# Patient Record
Sex: Male | Born: 1940
Health system: Southern US, Community
[De-identification: ages and names within clinical notes are randomized; demographics above are authoritative.]

## PROBLEM LIST (undated history)

## (undated) DIAGNOSIS — T7840XA Allergy, unspecified, initial encounter: Secondary | ICD-10-CM

## (undated) DIAGNOSIS — Z860100 Personal history of colon polyps, unspecified: Secondary | ICD-10-CM

## (undated) DIAGNOSIS — I10 Essential (primary) hypertension: Secondary | ICD-10-CM

## (undated) DIAGNOSIS — E785 Hyperlipidemia, unspecified: Secondary | ICD-10-CM

## (undated) DIAGNOSIS — M1990 Unspecified osteoarthritis, unspecified site: Secondary | ICD-10-CM

## (undated) DIAGNOSIS — H269 Unspecified cataract: Secondary | ICD-10-CM

## (undated) DIAGNOSIS — R011 Cardiac murmur, unspecified: Secondary | ICD-10-CM

## (undated) DIAGNOSIS — I219 Acute myocardial infarction, unspecified: Secondary | ICD-10-CM

## (undated) DIAGNOSIS — A419 Sepsis, unspecified organism: Secondary | ICD-10-CM

## (undated) DIAGNOSIS — J189 Pneumonia, unspecified organism: Secondary | ICD-10-CM

## (undated) DIAGNOSIS — J45909 Unspecified asthma, uncomplicated: Secondary | ICD-10-CM

## (undated) HISTORY — DX: Personal history of colon polyps, unspecified: Z86.0100

## (undated) HISTORY — DX: Hyperlipidemia, unspecified: E78.5

## (undated) HISTORY — DX: Personal history of colonic polyps: Z86.010

## (undated) HISTORY — DX: Unspecified asthma, uncomplicated: J45.909

## (undated) HISTORY — DX: Unspecified osteoarthritis, unspecified site: M19.90

## (undated) HISTORY — DX: Allergy, unspecified, initial encounter: T78.40XA

## (undated) HISTORY — PX: POLYPECTOMY: SHX149

## (undated) HISTORY — DX: Cardiac murmur, unspecified: R01.1

## (undated) HISTORY — PX: CARDIAC CATHETERIZATION: SHX172

## (undated) HISTORY — DX: Essential (primary) hypertension: I10

## (undated) HISTORY — DX: Acute myocardial infarction, unspecified: I21.9

## (undated) HISTORY — PX: CORONARY STENT PLACEMENT: SHX1402

## (undated) HISTORY — PX: COLONOSCOPY: SHX174

## (undated) HISTORY — PX: CATARACT EXTRACTION: SUR2

## (undated) HISTORY — DX: Unspecified cataract: H26.9

## (undated) HISTORY — PX: INGUINAL HERNIA REPAIR: SUR1180

---

## 2002-06-05 ENCOUNTER — Encounter (INDEPENDENT_AMBULATORY_CARE_PROVIDER_SITE_OTHER): Payer: Self-pay | Admitting: *Deleted

## 2002-06-05 ENCOUNTER — Ambulatory Visit (HOSPITAL_BASED_OUTPATIENT_CLINIC_OR_DEPARTMENT_OTHER): Admission: RE | Admit: 2002-06-05 | Discharge: 2002-06-05 | Payer: Self-pay | Admitting: General Surgery

## 2005-05-03 ENCOUNTER — Encounter: Payer: Self-pay | Admitting: Gastroenterology

## 2010-04-27 ENCOUNTER — Ambulatory Visit: Payer: Self-pay | Admitting: Gastroenterology

## 2010-04-27 DIAGNOSIS — Z860100 Personal history of colon polyps, unspecified: Secondary | ICD-10-CM | POA: Insufficient documentation

## 2010-04-27 DIAGNOSIS — I2510 Atherosclerotic heart disease of native coronary artery without angina pectoris: Secondary | ICD-10-CM | POA: Insufficient documentation

## 2010-04-27 DIAGNOSIS — R195 Other fecal abnormalities: Secondary | ICD-10-CM

## 2010-06-22 ENCOUNTER — Ambulatory Visit: Payer: Self-pay | Admitting: Gastroenterology

## 2010-06-24 ENCOUNTER — Encounter: Payer: Self-pay | Admitting: Gastroenterology

## 2010-07-16 ENCOUNTER — Telehealth: Payer: Self-pay | Admitting: Gastroenterology

## 2010-08-02 ENCOUNTER — Ambulatory Visit: Payer: Self-pay | Admitting: Gastroenterology

## 2010-08-02 LAB — CONVERTED CEMR LAB
Fecal Occult Blood: NEGATIVE
OCCULT 1: NEGATIVE
OCCULT 2: NEGATIVE
OCCULT 3: NEGATIVE
OCCULT 4: NEGATIVE
OCCULT 5: NEGATIVE

## 2010-08-08 ENCOUNTER — Encounter: Payer: Self-pay | Admitting: Gastroenterology

## 2010-10-19 NOTE — Letter (Signed)
Summary: Results Letter  [facility]  [address] [place], [place] [address]   Phone: [phone]  Fax: [phone]        August 08, 2010 MRN: [id number]    [facility] [address] [place], [place]  [address]    Dear Mr. Estill,  Your hemeoccult cards testing for microscopic bleeding were negative.  No further GI workup is required at this time.   Please continue with the recommendations that we previously discussed. Should you have any further questions or concerns, feel free to contact me.    Sincerely,  Barbette Hair. Arlyce Dice, M.D., [facility]         Sincerely,  Louis Meckel MD  This letter has been electronically signed by your physician.  Appended Document: Results Letter letter mailed

## 2010-10-19 NOTE — Letter (Signed)
Summary: [facility] Instructions  [facility]  [address] [place], [place] [address]   Phone: [phone]  Fax: [phone]       Carlos Patton    13-Feb-1941    MRN: [id number]        Procedure Day /Date:TUESDAY 06/22/2010     Arrival Time:8:30AM     Procedure Time:9:30AM     Location of Procedure:                    X   [facility] (4th Floor)                        PREPARATION FOR COLONOSCOPY WITH MOVIPREP   Starting 5 days prior to your procedure 06/18/2010 do not eat nuts, seeds, popcorn, corn, beans, peas,  salads, or any raw vegetables.  Do not take any fiber supplements (e.g. Metamucil, Citrucel, and Benefiber).  THE DAY BEFORE YOUR PROCEDURE         DATE: 06/21/2010 DAY: MONDAY  1.  Drink clear liquids the entire day-NO SOLID FOOD  2.  Do not drink anything colored red or purple.  Avoid juices with pulp.  No orange juice.  3.  Drink at least 64 oz. (8 glasses) of fluid/clear liquids during the day to prevent dehydration and help the prep work efficiently.  CLEAR LIQUIDS INCLUDE: Water Jello Ice Popsicles Tea (sugar ok, no milk/cream) Powdered fruit flavored drinks Coffee (sugar ok, no milk/cream) Gatorade Juice: apple, white grape, white cranberry  Lemonade Clear bullion, consomm, broth Carbonated beverages (any kind) Strained chicken noodle soup Hard Candy                             4.  In the morning, mix first dose of MoviPrep solution:    Empty 1 Pouch A and 1 Pouch B into the disposable container    Add lukewarm drinking water to the top line of the container. Mix to dissolve    Refrigerate (mixed solution should be used within 24 hrs)  5.  Begin drinking the prep at 5:00 p.m. The MoviPrep container is divided by 4 marks.   Every 15 minutes drink the solution down to the next mark (approximately 8 oz) until the full liter is complete.   6.  Follow completed prep with 16 oz of clear liquid of your choice (Nothing  red or purple).  Continue to drink clear liquids until bedtime.  7.  Before going to bed, mix second dose of MoviPrep solution:    Empty 1 Pouch A and 1 Pouch B into the disposable container    Add lukewarm drinking water to the top line of the container. Mix to dissolve    Refrigerate  THE DAY OF YOUR PROCEDURE      DATE: 06/22/2010 DAY: TUESDAY Beginning at 9/30/2011a.m. (5 hours before procedure):         1. Every 15 minutes, drink the solution down to the next mark (approx 8 oz) until the full liter is complete.  2. Follow completed prep with 16 oz. of clear liquid of your choice.    3. You may drink clear liquids until 7:30AM (2 HOURS BEFORE PROCEDURE).   MEDICATION INSTRUCTIONS  Unless otherwise instructed, you should take regular prescription medications with a small sip of water   as early as possible the morning of your procedure.  OTHER INSTRUCTIONS  You will need a responsible adult at least 71 years of age to accompany you and drive you home.   This person must remain in the waiting room during your procedure.  Wear loose fitting clothing that is easily removed.  Leave jewelry and other valuables at home.  However, you may wish to bring a book to read or  an iPod/MP3 player to listen to music as you wait for your procedure to start.  Remove all body piercing jewelry and leave at home.  Total time from sign-in until discharge is approximately 2-3 hours.  You should go home directly after your procedure and rest.  You can resume normal activities the  day after your procedure.  The day of your procedure you should not:   Drive   Make legal decisions   Operate machinery   Drink alcohol   Return to work  You will receive specific instructions about eating, activities and medications before you leave.    The above instructions have been reviewed and explained to me by   _______________________    I fully understand and can verbalize  these instructions _____________________________ Date _________

## 2010-10-19 NOTE — Assessment & Plan Note (Signed)
Summary: blood in stool/? time for colon/lk   History of Present Illness Visit Type: Initial Visit Primary GI MD: Melvia Heaps MD [facility] Primary Provider: Elenor Quinones, MD Chief Complaint: blood in stool needs colonoscopy History of Present Illness:   Carlos Patton is a pleasant 70 year old white male referred at the request of Dr. Kathaleen Bury for heme-positive stools.  This was noted on routine testing.  At times he has had minimal blood with a bowel movement that he attriburtes to straining and to a hemorrhoid.  The patient denies change in bowel habits, abdominal or rectal pain.  His history of adenomatous polyps that were removed in 2006.  Family history is pertinent for a sister who developed colon cancer in her 42s.   GI Review of Systems      Denies abdominal pain, acid reflux, belching, bloating, chest pain, dysphagia with liquids, dysphagia with solids, heartburn, loss of appetite, nausea, vomiting, vomiting blood, weight loss, and  weight gain.      Reports hemorrhoids and  rectal bleeding.     Denies anal fissure, black tarry stools, change in bowel habit, constipation, diarrhea, diverticulosis, fecal incontinence, heme positive stool, irritable bowel syndrome, jaundice, light color stool, liver problems, and  rectal pain.    Current Medications (verified): 1)  Trexall 5 Mg Tabs (Methotrexate Sodium) .... 5 Per Week 2)  Atenolol 25 Mg Tabs (Atenolol) .Marland Kitchen.. 1 By Mouth Once Daily 3)  Aspirin 325 Mg Tabs (Aspirin) .Marland Kitchen.. 1 By Mouth Once Daily  Allergies (verified): No Known Drug Allergies  Past History:  Past Medical History: Arthritis Adenomatous Colon Polyps Coronary Artery Disease  Past Surgical History: Heart Stent Placement Inguinal Hernia repair x 2  Family History: Reviewed history and no changes required. Family History of Colon Cancer:sister diabetes 2 sisters Heart Disease father,p -grandfather, p-grandmother, sister  Social History: Reviewed history and no  changes required. Married  2 boys Retired Quit 40 years ago Alcohol Use - no Daily Caffeine Use Illicit Drug Use - no Drug Use:  no  Review of Systems  The patient denies allergy/sinus, anemia, anxiety-new, arthritis/joint pain, back pain, blood in urine, breast changes/lumps, change in vision, confusion, cough, coughing up blood, depression-new, fainting, fatigue, fever, headaches-new, hearing problems, heart murmur, heart rhythm changes, itching, muscle pains/cramps, night sweats, nosebleeds, shortness of breath, skin rash, sleeping problems, sore throat, swelling of feet/legs, swollen lymph glands, thirst - excessive, urination - excessive, urination changes/pain, urine leakage, vision changes, and voice change.    Vital Signs:  Patient profile:   70 year old male Height:      75 inches Weight:      214 pounds BMI:     26.84 Pulse rate:   68 / minute Pulse rhythm:   regular BP sitting:   126 / 72  (left arm)  Vitals Entered By: Milford Cage NCMA (April 27, 2010 1:49 PM)  Physical Exam  Additional Exam:  On physical exam he is a well-developed well-nourished male  Physical Exam: General:   WDWN HEENT:   anicteric.  No pharyngeal abnormalities Neck:   No masses, thyroidmegaly Nodes:   No cervical, axillary, inguinal adenopathy Chest:    Clear to auscultation Cardiac:   No murmurs, gallops, rubs Abdomen:   BS active.  No abd masses, tenderness, organomegaly Rectal:   Deferred Extremities:   No cyanosis, clubbing, edema Skeletal:   No deformities Neuro:   Alert, oriented x3.  No focal abnormalities       Impression & Recommendations:  Problem # 1:  NONSPECIFIC ABNORMAL FINDING IN STOOL CONTENTS (ICD-792.1)  Plan colonoscopy to rule out recurrent polyps and other GI bleeding sources including hemorrhoids AVMs and neoplasm  Risks, alternatives, and complications of the procedure, including bleeding, perforation, and possible need for surgery, were  explained to the patient.  Patient's questions were answered.  Orders: Colonoscopy (Colon)  Problem # 2:  PERSONAL HISTORY OF COLONIC POLYPS (ICD-V12.72) Assessment: Comment Only  Orders: Colonoscopy (Colon)  Problem # 3:  FM HX MALIGNANT NEOPLASM GASTROINTESTINAL TRACT (ICD-V16.0)  Plan colonoscopy if he 5 years for the next 10 years  Orders: Colonoscopy (Colon)  Problem # 4:  CORONARY ARTERY DISEASE (ICD-414.00) Assessment: Comment Only  Patient Instructions: 1)  Copy sent to : Elenor Quinones, MD 2)  Colonoscopy and Flexible Sigmoidoscopy brochure given.  3)  Conscious Sedation brochure given.  4)  Your colonoscopy is scheduled for 06/22/2010 at 9:30am 5)  You can pick up your MoviPrep from your pharmacy today 6)  The medication list was reviewed and reconciled.  All changed / newly prescribed medications were explained.  A complete medication list was provided to the patient / caregiver. Prescriptions: MOVIPREP 100 GM  SOLR (PEG-KCL-NACL-NASULF-NA ASC-C) As per prep instructions.  #1 x 0   Entered by:   Merri Ray CMA (AAMA)   Authorized by:   Louis Meckel MD   Signed by:   Merri Ray CMA (AAMA) on 04/27/2010   Method used:   Electronically to        [facility] (retail)       [address]       [place], [place]  [address]       Ph: [phone]       Fax: [phone]   RxID:   [id number]

## 2010-10-19 NOTE — Progress Notes (Signed)
Summary: Hemoccult reminder  Phone Note Outgoing Call Call back at [facility] Phone [phone]   Call placed by: Merri Ray CMA Duncan Dull),  July 16, 2010 2:50 PM Summary of Call: Called pt to remind him to return his hemoccult cards. Spoke with wife and she stated she had tried and tried to get him to do them and go ahead and turn them in but he has not done them yet. She will say something else to him today. She did state that pt was doing very well. Tols pt to call back as needed  Initial call taken by: Merri Ray CMA Duncan Dull),  July 16, 2010 2:51 PM

## 2010-10-19 NOTE — Procedures (Signed)
Summary: Colonoscopy/[facility]  Colonoscopy/[facility]   Imported By: Sherian Rein 04/30/2010 10:18:05  _____________________________________________________________________  External Attachment:    Type:   Image     Comment:   External Document

## 2010-10-19 NOTE — Letter (Signed)
Summary: Results Letter  [facility]  [address] [place], [place] [address]   Phone: [phone]  Fax: [phone]        April 27, 2010 MRN: [id number]    [facility] [address] [place], [place]  [address]    Dear Mr. Wadsworth,  It is my pleasure to have treated you recently as a new patient in my office. I appreciate your confidence and the opportunity to participate in your care.  Since I do have a busy inpatient endoscopy schedule and office schedule, my office hours vary weekly. I am, however, available for emergency calls everyday through my office. If I am not available for an urgent office appointment, another one of our gastroenterologist will be able to assist you.  My well-trained staff are prepared to help you at all times. For emergencies after office hours, a physician from our Gastroenterology section is always available through my 24 hour answering service  Once again I welcome you as a new patient and I look forward to a happy and healthy relationship             Sincerely,  Louis Meckel MD  This letter has been electronically signed by your physician.  Appended Document: Results Letter LETTER MAILED

## 2010-10-19 NOTE — Procedures (Signed)
Summary: Colonoscopy  Patient: Carlos Patton Note: All result statuses are Final unless otherwise noted.  Tests: (1) Colonoscopy (COL)   COL Colonoscopy           DONE     [facility]     [address]     [place], [place]  [address]           COLONOSCOPY PROCEDURE REPORT           PATIENT:  Carlos Patton, Carlos Patton  MR#:  [id number]     BIRTHDATE:  Aug 07, 1941, 68 yrs. old  GENDER:  male           ENDOSCOPIST:  Barbette Hair. Arlyce Dice, MD     Referred by:           PROCEDURE DATE:  06/22/2010     PROCEDURE:  Colon with cold biopsy polypectomy     ASA CLASS:  Class II     INDICATIONS:  1) heme positive stool  2) history of pre-cancerous     (adenomatous) colon polyps  3) family history of colon cancer     Sister           MEDICATIONS:   Fentanyl 50 mcg IV, Versed 5 mg IV           DESCRIPTION OF PROCEDURE:   After the risks benefits and     alternatives of the procedure were thoroughly explained, informed     consent was obtained.  Digital rectal exam was performed and     revealed no abnormalities.   The [id number] endoscope was     introduced through the anus and advanced to the cecum, which was     identified by the ileocecal valve, without limitations.  The     quality of the prep was excellent, using MiraLax.  The instrument     was then slowly withdrawn as the colon was fully examined.     <<PROCEDUREIMAGES>>           FINDINGS:  Two polyps were found (see image7 and image8). 2 1-23mm     polyps midtransverse colon - removed with cold bx forceps,     submitted to pathology  Mild diverticulosis was found in the     sigmoid colon (see image17 and image16).  This was otherwise a     normal examination of the colon (see image1, image3, image4,     image6, image10, image13, image14, image18, and image19).     Retroflexed views in the rectum revealed no abnormalities.    The     time to cecum =  4.50  minutes. The scope was then withdrawn (time     =  11.30  min) from the  patient and the procedure completed.           COMPLICATIONS:  None           ENDOSCOPIC IMPRESSION:     1) Two diminutive polyps     2) Mild diverticulosis in the sigmoid colon     3) Otherwise normal examination     RECOMMENDATIONS:     1) Given your significant family history of colon cancer, you     should have a repeat colonoscopy in 5 years     2) followup hemeoccults in 10 days           REPEAT EXAM:  In 5 year(s) for Colonoscopy.           ______________________________  Barbette Hair. Arlyce Dice, MD           CC: Elenor Quinones MD           n.     Rosalie Doctor:   Barbette Hair. Azuree Minish at 06/22/2010 10:27 AM           Page 2 of 3   [place], [place], [id number]  Note: An exclamation mark (!) indicates a result that was not dispersed into the flowsheet. Document Creation Date: 06/22/2010 10:28 AM _______________________________________________________________________  (1) Order result status: Final Collection or observation date-time: 06/22/2010 10:20 Requested date-time:  Receipt date-time:  Reported date-time:  Referring Physician:   Ordering Physician: Melvia Heaps [phone]) Specimen Source:  Source: Launa Grill Order Number: [id number] Lab site:   Appended Document: Colonoscopy     Procedures Next Due Date:    Colonoscopy: 06/2015  Appended Document: Orders Update    Clinical Lists Changes  Orders: Added new Test order of [facility] Hemoccult Cards #3 (take home) (Hem cards #3) - Signed

## 2010-10-19 NOTE — Letter (Signed)
Summary: Patient Notice- Polyp Results  [facility]  [address] [place], [place] [address]   Phone: [phone]  Fax: [phone]        June 24, 2010 MRN: [id number]    [facility] [address] [place], [place]  [address]    Dear Carlos Patton,  I am pleased to inform you that the colon polyp(s) removed during your recent colonoscopy was (were) found to be benign (no cancer detected) upon pathologic examination.  I recommend you have a repeat colonoscopy examination in 5_ years to look for recurrent polyps, as having colon polyps increases your risk for having recurrent polyps or even colon cancer in the future.  Should you develop new or worsening symptoms of abdominal pain, bowel habit changes or bleeding from the rectum or bowels, please schedule an evaluation with either your primary care physician or with me.  Additional information/recommendations:  __ No further action with gastroenterology is needed at this time. Please      follow-up with your primary care physician for your other healthcare      needs.  __ Please call [phone] to schedule a return visit to review your      situation.  __ Please keep your follow-up visit as already scheduled.  _x_ Continue treatment plan as outlined the day of your exam.  Please call us if you are having persistent problems or have questions about your condition that have not been fully answered at this time.  Sincerely,  Louis Meckel MD  This letter has been electronically signed by your physician.  Appended Document: Patient Notice- Polyp Results letter mailed

## 2011-02-04 NOTE — Op Note (Signed)
   NAME:  Carlos Patton, Carlos Patton                       ACCOUNT NO.:  [id number]   MEDICAL RECORD NO.:  [id number]                   PATIENT TYPE:  AMB   LOCATION:  DSC                                  FACILITY:  [facility]   PHYSICIAN:  Ollen Gross. Vernell Morgans, M.D.              DATE OF BIRTH:  [birth year]   DATE OF PROCEDURE:  06/05/2002  DATE OF DISCHARGE:                                 OPERATIVE REPORT   PREOPERATIVE DIAGNOSIS:  Cyst on the right neck.   POSTOPERATIVE DIAGNOSIS:  Cyst on the right neck.   PROCEDURE:  Excision of cyst from right neck.   SURGEON:  Ollen Gross. Carolynne Edouard, M.D.   ASSISTANT:  Nurse.   ANESTHESIA:  Local.   DESCRIPTION OF PROCEDURE:  After informed consent was obtained, the patient  was brought to the operating room and placed in a supine position on the  operating table.  The area of the right neck was prepped with Betadine and  draped in the usual sterile manner.  The area in question was then  infiltrated with 1% lidocaine with epinephrine until a good field block was  obtained.  An elliptical incision was then made around the cyst sharply with  a 15 blade knife.  The cyst itself was then removed from the rest of the  subcutaneous tissue with continued sharp dissection with the 15 blade knife.  The remaining tissue appeared healthy.  The incision was then closed with  interrupted 4-0 Monocryl subcuticular stitches.  Benzoin and Steri-Strips  and sterile dressings were applied.  The patient tolerated the procedure  well.  At the end of the case all needle, sponge, and instrument counts were  correct.  The patient was awakened and taken to the recovery room in stable  condition.                                               Ollen Gross. Vernell Morgans, M.D.    PST/MEDQ  D:  06/06/2002  T:  06/07/2002  Job:  [id number]

## 2012-08-09 ENCOUNTER — Telehealth: Payer: Self-pay | Admitting: Internal Medicine

## 2012-08-09 NOTE — Telephone Encounter (Signed)
Pts wife called and said that she was returning a call from nurse about pt est with Dr Fabian Sharp. Pt has MCR and BC[employer]. Pls call.

## 2012-08-10 NOTE — Telephone Encounter (Signed)
Am currently not taking NEW medicare/ medicare  aged patients.  Please advise about how I am getting this request. Dont think i know this family.

## 2012-08-10 NOTE — Telephone Encounter (Signed)
This patient would like to establish with you.  Pt has Medicare and BCBS.  Please advise.

## 2012-08-13 NOTE — Telephone Encounter (Signed)
Called and lft pts spouse vm that Dr Fabian Sharp has declined pts req to est as pcp.

## 2015-06-02 ENCOUNTER — Encounter: Payer: Self-pay | Admitting: Gastroenterology

## 2015-08-17 ENCOUNTER — Encounter: Payer: Self-pay | Admitting: Gastroenterology

## 2015-10-06 ENCOUNTER — Ambulatory Visit (AMBULATORY_SURGERY_CENTER): Payer: Self-pay | Admitting: *Deleted

## 2015-10-06 VITALS — Ht 75.0 in | Wt 217.0 lb

## 2015-10-06 DIAGNOSIS — Z800 Family history of malignant neoplasm of digestive organs: Secondary | ICD-10-CM

## 2015-10-06 DIAGNOSIS — Z86010 Personal history of colonic polyps: Secondary | ICD-10-CM

## 2015-10-06 NOTE — Progress Notes (Signed)
No egg or soy allergy known to patient  No issues with past sedation with any surgeries  or procedures, no intubation problems  No diet pills per patient  No home 02 use per patient  No blood thinners but takes a 325 mg ASA daily

## 2015-10-12 DIAGNOSIS — H353122 Nonexudative age-related macular degeneration, left eye, intermediate dry stage: Secondary | ICD-10-CM | POA: Diagnosis not present

## 2015-10-12 DIAGNOSIS — H353112 Nonexudative age-related macular degeneration, right eye, intermediate dry stage: Secondary | ICD-10-CM | POA: Diagnosis not present

## 2015-10-12 DIAGNOSIS — H43813 Vitreous degeneration, bilateral: Secondary | ICD-10-CM | POA: Diagnosis not present

## 2015-10-12 DIAGNOSIS — D3132 Benign neoplasm of left choroid: Secondary | ICD-10-CM | POA: Diagnosis not present

## 2015-10-20 ENCOUNTER — Ambulatory Visit (AMBULATORY_SURGERY_CENTER): Payer: PPO | Admitting: Gastroenterology

## 2015-10-20 ENCOUNTER — Encounter: Payer: Self-pay | Admitting: Gastroenterology

## 2015-10-20 VITALS — BP 133/85 | HR 49 | Temp 97.0°F | Resp 16 | Ht 75.0 in | Wt 217.0 lb

## 2015-10-20 DIAGNOSIS — D123 Benign neoplasm of transverse colon: Secondary | ICD-10-CM

## 2015-10-20 DIAGNOSIS — J45909 Unspecified asthma, uncomplicated: Secondary | ICD-10-CM | POA: Diagnosis not present

## 2015-10-20 DIAGNOSIS — D122 Benign neoplasm of ascending colon: Secondary | ICD-10-CM | POA: Diagnosis not present

## 2015-10-20 DIAGNOSIS — Z86010 Personal history of colonic polyps: Secondary | ICD-10-CM | POA: Diagnosis not present

## 2015-10-20 DIAGNOSIS — D128 Benign neoplasm of rectum: Secondary | ICD-10-CM

## 2015-10-20 DIAGNOSIS — D124 Benign neoplasm of descending colon: Secondary | ICD-10-CM | POA: Diagnosis not present

## 2015-10-20 DIAGNOSIS — Z800 Family history of malignant neoplasm of digestive organs: Secondary | ICD-10-CM | POA: Diagnosis not present

## 2015-10-20 DIAGNOSIS — I252 Old myocardial infarction: Secondary | ICD-10-CM | POA: Diagnosis not present

## 2015-10-20 DIAGNOSIS — I10 Essential (primary) hypertension: Secondary | ICD-10-CM | POA: Diagnosis not present

## 2015-10-20 MED ORDER — SODIUM CHLORIDE 0.9 % IV SOLN: 500.0000 mL | INTRAVENOUS | Status: DC

## 2015-10-20 NOTE — Patient Instructions (Signed)
  AVOID NSAIDS (MOTRIN, ADVIL, IBUPROFEN, ALEVE, NAPROSYN ETC) FOR TWO WEEKS, FEBRUARY 15,2017.     YOU HAD AN ENDOSCOPIC PROCEDURE TODAY AT [facility]:   Refer to the procedure report that was given to you for any specific questions about what was found during the examination.  If the procedure report does not answer your questions, please call your gastroenterologist to clarify.  If you requested that your care partner not be given the details of your procedure findings, then the procedure report has been included in a sealed envelope for you to review at your convenience later.  YOU SHOULD EXPECT: Some feelings of bloating in the abdomen. Passage of more gas than usual.  Walking can help get rid of the air that was put into your GI tract during the procedure and reduce the bloating. If you had a lower endoscopy (such as a colonoscopy or flexible sigmoidoscopy) you may notice spotting of blood in your stool or on the toilet paper. If you underwent a bowel prep for your procedure, you may not have a normal bowel movement for a few days.  Please Note:  You might notice some irritation and congestion in your nose or some drainage.  This is from the oxygen used during your procedure.  There is no need for concern and it should clear up in a day or so.  SYMPTOMS TO REPORT IMMEDIATELY:   Following lower endoscopy (colonoscopy or flexible sigmoidoscopy):  Excessive amounts of blood in the stool  Significant tenderness or worsening of abdominal pains  Swelling of the abdomen that is new, acute  Fever of 100F or higher  For urgent or emergent issues, a gastroenterologist can be reached at any hour by calling [phone].   DIET: Your first meal following the procedure should be a small meal and then it is ok to progress to your normal diet. Heavy or fried foods are harder to digest and may make you feel nauseous or bloated.  Likewise, meals heavy in dairy and vegetables can  increase bloating.  Drink plenty of fluids but you should avoid alcoholic beverages for 24 hours.  ACTIVITY:  You should plan to take it easy for the rest of today and you should NOT DRIVE or use heavy machinery until tomorrow (because of the sedation medicines used during the test).    FOLLOW UP: Our staff will call the number listed on your records the next business day following your procedure to check on you and address any questions or concerns that you may have regarding the information given to you following your procedure. If we do not reach you, we will leave a message.  However, if you are feeling well and you are not experiencing any problems, there is no need to return our call.  We will assume that you have returned to your regular daily activities without incident.  If any biopsies were taken you will be contacted by phone or by letter within the next 1-3 weeks.  Please call us at [phone] if you have not heard about the biopsies in 3 weeks.    SIGNATURES/CONFIDENTIALITY: You and/or your care partner have signed paperwork which will be entered into your electronic medical record.  These signatures attest to the fact that that the information above on your After Visit Summary has been reviewed and is understood.  Full responsibility of the confidentiality of this discharge information lies with you and/or your care-partner.

## 2015-10-20 NOTE — Progress Notes (Signed)
Called to room to assist during endoscopic procedure.  Patient ID and intended procedure confirmed with present staff. Received instructions for my participation in the procedure from the performing physician.  

## 2015-10-20 NOTE — Op Note (Signed)
[place]  [address] [place], [id number]   COLONOSCOPY PROCEDURE REPORT  PATIENT: Carlos Patton, Carlos Patton  MR#: [id number] BIRTHDATE: Dec 18, 1940 , 74  yrs. old GENDER: male ENDOSCOPIST: Yetta Flock, MD REFERRED BY: PROCEDURE DATE:  10/20/2015 PROCEDURE:   Colonoscopy, surveillance , Colonoscopy with snare polypectomy, and Colonoscopy with biopsy First Screening Colonoscopy - Avg.  risk and is 50 yrs.  old or older - No.  Prior Negative Screening - Now for repeat screening. N/A  History of Adenoma - Now for follow-up colonoscopy & has been > or = to 3 yrs.  Yes hx of adenoma.  Has been 3 or more years since last colonoscopy.  Polyps removed today? Yes ASA CLASS:   Class III INDICATIONS:Surveillance due to prior colonic neoplasia and FH Colon or Rectal Adenocarcinoma. MEDICATIONS: Propofol 250 mg IV  DESCRIPTION OF PROCEDURE:   After the risks benefits and alternatives of the procedure were thoroughly explained, informed consent was obtained.  The digital rectal exam revealed no abnormalities of the rectum.   The LB [id number]  endoscope was introduced through the anus and advanced to the cecum, which was identified by both the appendix and ileocecal valve. No adverse events experienced.   The quality of the prep was adequate  The instrument was then slowly withdrawn as the colon was fully examined. Estimated blood loss is zero unless otherwise noted in this procedure report.    COLON FINDINGS: Three x 1mm sessile polyps were noted in the ascending colon and removed with cold forceps.  A 42mm sessile polyp was noted in the hepatic flexure and removed with cold forceps.  A penduculated 6-69mm polyp was noted in the transverse colon and removed with hot snare.  A sessile 5-34mm polyp was noted in the transverse colon and removed via cold snare.  A 61mm sessile polyp was noted in the descending colon and removed with cold snare.  A 71mm sessile rectal  polyp was noted and removed with cold forceps. Moderate diverticulosis was noted in the left colon and mild diverticulosis noted in the right colon.  Retroflexed views revealed internal hemorrhoids. The time to cecum = 7.3 (including polypectomy) Withdrawal time = 17.2   The scope was withdrawn and the procedure completed. COMPLICATIONS: There were no immediate complications.   ENDOSCOPIC IMPRESSION: 8 polyps removed as outlined above Moderate diverticulosis Internal hemorrhoids  RECOMMENDATIONS: Resume diet Resume medications including aspirin NO other NSAIDS for 2 weeks Await pathology results  eSigned:  Yetta Flock, MD 10/20/2015 9:41 AM   cc:  the patient   PATIENT NAME:  Carlos Patton, Carlos Patton MR#: [id number]

## 2015-10-21 ENCOUNTER — Telehealth: Payer: Self-pay

## 2015-10-21 NOTE — Telephone Encounter (Signed)
  Follow up Call-  Call back number 10/20/2015  Post procedure Call Back phone  # [phone]  Permission to leave phone message Yes     Patient questions:  Do you have a fever, pain , or abdominal swelling? No. Pain Score  0 *  Have you tolerated food without any problems? Yes.    Have you been able to return to your normal activities? Yes.    Do you have any questions about your discharge instructions: Diet   No. Medications  No. Follow up visit  No.  Do you have questions or concerns about your Care? No.  Actions: * If pain score is 4 or above: No action needed, pain <4.

## 2015-10-28 ENCOUNTER — Encounter: Payer: Self-pay | Admitting: Gastroenterology

## 2015-11-24 DIAGNOSIS — M0579 Rheumatoid arthritis with rheumatoid factor of multiple sites without organ or systems involvement: Secondary | ICD-10-CM | POA: Diagnosis not present

## 2015-11-24 DIAGNOSIS — Z79899 Other long term (current) drug therapy: Secondary | ICD-10-CM | POA: Diagnosis not present

## 2015-11-24 DIAGNOSIS — M25561 Pain in right knee: Secondary | ICD-10-CM | POA: Diagnosis not present

## 2015-11-24 DIAGNOSIS — Z09 Encounter for follow-up examination after completed treatment for conditions other than malignant neoplasm: Secondary | ICD-10-CM | POA: Diagnosis not present

## 2015-11-30 DIAGNOSIS — R972 Elevated prostate specific antigen [PSA]: Secondary | ICD-10-CM | POA: Diagnosis not present

## 2016-03-11 DIAGNOSIS — R972 Elevated prostate specific antigen [PSA]: Secondary | ICD-10-CM | POA: Diagnosis not present

## 2016-03-11 DIAGNOSIS — N400 Enlarged prostate without lower urinary tract symptoms: Secondary | ICD-10-CM | POA: Diagnosis not present

## 2016-03-18 DIAGNOSIS — Z79899 Other long term (current) drug therapy: Secondary | ICD-10-CM | POA: Diagnosis not present

## 2016-03-27 DIAGNOSIS — B029 Zoster without complications: Secondary | ICD-10-CM | POA: Diagnosis not present

## 2016-04-27 DIAGNOSIS — M79641 Pain in right hand: Secondary | ICD-10-CM | POA: Diagnosis not present

## 2016-04-27 DIAGNOSIS — Z79899 Other long term (current) drug therapy: Secondary | ICD-10-CM | POA: Diagnosis not present

## 2016-04-27 DIAGNOSIS — M0579 Rheumatoid arthritis with rheumatoid factor of multiple sites without organ or systems involvement: Secondary | ICD-10-CM | POA: Diagnosis not present

## 2016-04-29 DIAGNOSIS — J4530 Mild persistent asthma, uncomplicated: Secondary | ICD-10-CM | POA: Diagnosis not present

## 2016-04-29 DIAGNOSIS — J309 Allergic rhinitis, unspecified: Secondary | ICD-10-CM | POA: Diagnosis not present

## 2016-06-01 DIAGNOSIS — Z79899 Other long term (current) drug therapy: Secondary | ICD-10-CM | POA: Diagnosis not present

## 2016-06-24 DIAGNOSIS — R3912 Poor urinary stream: Secondary | ICD-10-CM | POA: Diagnosis not present

## 2016-06-24 DIAGNOSIS — N401 Enlarged prostate with lower urinary tract symptoms: Secondary | ICD-10-CM | POA: Diagnosis not present

## 2016-06-24 DIAGNOSIS — R972 Elevated prostate specific antigen [PSA]: Secondary | ICD-10-CM | POA: Diagnosis not present

## 2016-06-24 DIAGNOSIS — N138 Other obstructive and reflux uropathy: Secondary | ICD-10-CM | POA: Insufficient documentation

## 2016-07-11 DIAGNOSIS — I10 Essential (primary) hypertension: Secondary | ICD-10-CM | POA: Diagnosis not present

## 2016-07-11 DIAGNOSIS — E559 Vitamin D deficiency, unspecified: Secondary | ICD-10-CM | POA: Diagnosis not present

## 2016-07-11 DIAGNOSIS — H02403 Unspecified ptosis of bilateral eyelids: Secondary | ICD-10-CM | POA: Diagnosis not present

## 2016-07-11 DIAGNOSIS — Z961 Presence of intraocular lens: Secondary | ICD-10-CM | POA: Diagnosis not present

## 2016-07-11 DIAGNOSIS — H353132 Nonexudative age-related macular degeneration, bilateral, intermediate dry stage: Secondary | ICD-10-CM | POA: Diagnosis not present

## 2016-07-11 DIAGNOSIS — D3132 Benign neoplasm of left choroid: Secondary | ICD-10-CM | POA: Diagnosis not present

## 2016-07-18 DIAGNOSIS — M069 Rheumatoid arthritis, unspecified: Secondary | ICD-10-CM | POA: Diagnosis not present

## 2016-07-18 DIAGNOSIS — Z86010 Personal history of colonic polyps: Secondary | ICD-10-CM | POA: Diagnosis not present

## 2016-07-18 DIAGNOSIS — R972 Elevated prostate specific antigen [PSA]: Secondary | ICD-10-CM | POA: Diagnosis not present

## 2016-07-18 DIAGNOSIS — Z1389 Encounter for screening for other disorder: Secondary | ICD-10-CM | POA: Diagnosis not present

## 2016-07-18 DIAGNOSIS — Z0000 Encounter for general adult medical examination without abnormal findings: Secondary | ICD-10-CM | POA: Diagnosis not present

## 2016-07-18 DIAGNOSIS — Z6828 Body mass index (BMI) 28.0-28.9, adult: Secondary | ICD-10-CM | POA: Diagnosis not present

## 2016-07-18 DIAGNOSIS — E559 Vitamin D deficiency, unspecified: Secondary | ICD-10-CM | POA: Diagnosis not present

## 2016-07-18 DIAGNOSIS — E784 Other hyperlipidemia: Secondary | ICD-10-CM | POA: Diagnosis not present

## 2016-07-18 DIAGNOSIS — M859 Disorder of bone density and structure, unspecified: Secondary | ICD-10-CM | POA: Diagnosis not present

## 2016-07-18 DIAGNOSIS — Z23 Encounter for immunization: Secondary | ICD-10-CM | POA: Diagnosis not present

## 2016-07-18 DIAGNOSIS — I10 Essential (primary) hypertension: Secondary | ICD-10-CM | POA: Diagnosis not present

## 2016-07-18 DIAGNOSIS — I25118 Atherosclerotic heart disease of native coronary artery with other forms of angina pectoris: Secondary | ICD-10-CM | POA: Diagnosis not present

## 2016-07-22 DIAGNOSIS — Z1212 Encounter for screening for malignant neoplasm of rectum: Secondary | ICD-10-CM | POA: Diagnosis not present

## 2016-08-04 ENCOUNTER — Other Ambulatory Visit: Payer: Self-pay | Admitting: Rheumatology

## 2016-08-04 DIAGNOSIS — Z79899 Other long term (current) drug therapy: Secondary | ICD-10-CM | POA: Diagnosis not present

## 2016-08-05 LAB — COMPREHENSIVE METABOLIC PANEL
ALBUMIN: 4 g/dL (ref 3.5–4.8)
ALK PHOS: 106 IU/L (ref 39–117)
ALT: 18 IU/L (ref 0–44)
AST: 33 IU/L (ref 0–40)
Albumin/Globulin Ratio: 1.7 (ref 1.2–2.2)
BUN / CREAT RATIO: 12 (ref 10–24)
BUN: 13 mg/dL (ref 8–27)
Bilirubin Total: 0.6 mg/dL (ref 0.0–1.2)
CO2: 28 mmol/L (ref 18–29)
CREATININE: 1.07 mg/dL (ref 0.76–1.27)
Calcium: 9.5 mg/dL (ref 8.6–10.2)
Chloride: 102 mmol/L (ref 96–106)
GFR calc Af Amer: 78 mL/min/{1.73_m2} (ref >59)
GFR calc non Af Amer: 67 mL/min/{1.73_m2} (ref >59)
GLUCOSE: 70 mg/dL (ref 65–99)
Globulin, Total: 2.4 g/dL (ref 1.5–4.5)
Potassium: 4.3 mmol/L (ref 3.5–5.2)
Sodium: 143 mmol/L (ref 134–144)
Total Protein: 6.4 g/dL (ref 6.0–8.5)

## 2016-08-05 LAB — CBC WITH DIFFERENTIAL/PLATELET
BASOS ABS: 0 10*3/uL (ref 0.0–0.2)
Basos: 0 %
EOS (ABSOLUTE): 0.2 10*3/uL (ref 0.0–0.4)
EOS: 3 %
HEMATOCRIT: 41.9 % (ref 37.5–51.0)
HEMOGLOBIN: 14 g/dL (ref 12.6–17.7)
IMMATURE GRANULOCYTES: 0 %
Immature Grans (Abs): 0 10*3/uL (ref 0.0–0.1)
LYMPHS ABS: 1.2 10*3/uL (ref 0.7–3.1)
LYMPHS: 21 %
MCH: 32.7 pg (ref 26.6–33.0)
MCHC: 33.4 g/dL (ref 31.5–35.7)
MCV: 98 fL — ABNORMAL HIGH (ref 79–97)
MONOCYTES: 9 %
Monocytes Absolute: 0.5 10*3/uL (ref 0.1–0.9)
NEUTROS PCT: 67 %
Neutrophils Absolute: 3.8 10*3/uL (ref 1.4–7.0)
Platelets: 151 10*3/uL (ref 150–379)
RBC: 4.28 x10E6/uL (ref 4.14–5.80)
RDW: 13.9 % (ref 12.3–15.4)
WBC: 5.7 10*3/uL (ref 3.4–10.8)

## 2016-08-10 ENCOUNTER — Telehealth: Payer: Self-pay | Admitting: Radiology

## 2016-08-10 NOTE — Telephone Encounter (Signed)
Call pt / labs normal

## 2016-08-10 NOTE — Telephone Encounter (Signed)
Called patient to advise labs normal

## 2016-10-04 ENCOUNTER — Other Ambulatory Visit: Payer: Self-pay | Admitting: Rheumatology

## 2016-10-04 MED ORDER — METHOTREXATE 2.5 MG PO TABS: 15.0000 mg | ORAL_TABLET | ORAL | 0 refills | Status: DC

## 2016-10-04 NOTE — Telephone Encounter (Signed)
Last Visit: 04/27/16 Next Visit: 10/24/16 Labs: 10/24/16  Okay to refill MTX?

## 2016-10-04 NOTE — Telephone Encounter (Signed)
Okay 

## 2016-10-04 NOTE — Telephone Encounter (Signed)
Patient needs refill of MTX to be sent to [facility] mail order pharmacy. He will run out before his scheduled appointment on 10/24/16.

## 2016-10-24 ENCOUNTER — Encounter: Payer: Self-pay | Admitting: Rheumatology

## 2016-10-24 ENCOUNTER — Ambulatory Visit (INDEPENDENT_AMBULATORY_CARE_PROVIDER_SITE_OTHER): Payer: PPO | Admitting: Rheumatology

## 2016-10-24 VITALS — BP 138/76 | HR 66 | Resp 16 | Ht 74.5 in | Wt 218.0 lb

## 2016-10-24 DIAGNOSIS — M25561 Pain in right knee: Secondary | ICD-10-CM

## 2016-10-24 DIAGNOSIS — M25562 Pain in left knee: Secondary | ICD-10-CM

## 2016-10-24 DIAGNOSIS — G8929 Other chronic pain: Secondary | ICD-10-CM

## 2016-10-24 DIAGNOSIS — Z79899 Other long term (current) drug therapy: Secondary | ICD-10-CM

## 2016-10-24 DIAGNOSIS — M0579 Rheumatoid arthritis with rheumatoid factor of multiple sites without organ or systems involvement: Secondary | ICD-10-CM | POA: Diagnosis not present

## 2016-10-24 DIAGNOSIS — M069 Rheumatoid arthritis, unspecified: Secondary | ICD-10-CM | POA: Diagnosis not present

## 2016-10-24 DIAGNOSIS — M0630 Rheumatoid nodule, unspecified site: Secondary | ICD-10-CM

## 2016-10-24 LAB — CBC WITH DIFFERENTIAL/PLATELET
BASOS PCT: 1 %
Basophils Absolute: 52 cells/uL (ref 0–200)
EOS PCT: 3 %
Eosinophils Absolute: 156 cells/uL (ref 15–500)
HCT: 44.4 % (ref 38.5–50.0)
HEMOGLOBIN: 14.6 g/dL (ref 13.2–17.1)
LYMPHS PCT: 23 %
Lymphs Abs: 1196 cells/uL (ref 850–3900)
MCH: 32.6 pg (ref 27.0–33.0)
MCHC: 32.9 g/dL (ref 32.0–36.0)
MCV: 99.1 fL (ref 80.0–100.0)
MPV: 11 fL (ref 7.5–12.5)
Monocytes Absolute: 624 cells/uL (ref 200–950)
Monocytes Relative: 12 %
NEUTROS PCT: 61 %
Neutro Abs: 3172 cells/uL (ref 1500–7800)
Platelets: 148 10*3/uL (ref 140–400)
RBC: 4.48 MIL/uL (ref 4.20–5.80)
RDW: 14.5 % (ref 11.0–15.0)
WBC: 5.2 10*3/uL (ref 3.8–10.8)

## 2016-10-24 LAB — COMPLETE METABOLIC PANEL WITH GFR
ALT: 11 U/L (ref 9–46)
AST: 27 U/L (ref 10–35)
Albumin: 4.1 g/dL (ref 3.6–5.1)
Alkaline Phosphatase: 77 U/L (ref 40–115)
BUN: 12 mg/dL (ref 7–25)
CALCIUM: 9.7 mg/dL (ref 8.6–10.3)
CHLORIDE: 106 mmol/L (ref 98–110)
CO2: 31 mmol/L (ref 20–31)
CREATININE: 0.91 mg/dL (ref 0.70–1.18)
GFR, Est African American: >89 mL/min (ref >=60)
GFR, Est Non African American: 82 mL/min (ref >=60)
Glucose, Bld: 83 mg/dL (ref 65–99)
POTASSIUM: 4.2 mmol/L (ref 3.5–5.3)
Sodium: 141 mmol/L (ref 135–146)
Total Bilirubin: 1 mg/dL (ref 0.2–1.2)
Total Protein: 6.4 g/dL (ref 6.1–8.1)

## 2016-10-24 MED ORDER — METHOTREXATE 2.5 MG PO TABS
12.5000 mg | ORAL_TABLET | ORAL | 0 refills | Status: DC
Start: 2016-10-24 — End: 2017-04-12

## 2016-10-24 NOTE — Progress Notes (Signed)
Office Visit Note  Patient: Carlos Patton             Date of Birth: August 31, 1941           MRN: [id number]             PCP: Velna Hatchet, MD Referring: Velna Hatchet, MD Visit Date: 10/24/2016 Occupation: @GUAROCC @    Subjective:  Follow-up Doing well with rheumatoid arthritis  History of Present Illness: Carlos Patton is a 76 y.o. male  Last seen August 2017 Doing well with rheumatoid arthritis. Has ongoing nodulocystic but is not bothering the patient.  We advised him to try methotrexate 6 per week. He, on his own, decided to go back to 5 per week and did not notice a difference. He prefers to stay on 5 pills per week.  Last labs from November and they're due for labs today. He will do them in office today. In about 3 months she'll go back in [facility] and do his 3 month labs  Morning stiffness of less than 5 minutes.   Activities of Daily Living:  Patient reports morning stiffness for 5 minutes.   Patient Denies nocturnal pain.  Difficulty dressing/grooming: Denies Difficulty climbing stairs: Denies Difficulty getting out of chair: Denies Difficulty using hands for taps, buttons, cutlery, and/or writing: Denies   Review of Systems  Constitutional: Negative for fatigue.  HENT: Negative for mouth sores and mouth dryness.   Eyes: Negative for dryness.  Respiratory: Negative for shortness of breath.   Gastrointestinal: Negative for constipation and diarrhea.  Musculoskeletal: Negative for myalgias and myalgias.  Skin: Negative for sensitivity to sunlight.  Neurological: Negative for memory loss.  Psychiatric/Behavioral: Negative for sleep disturbance.    PMFS History:  Patient Active Problem List   Diagnosis Date Noted  . CORONARY ARTERY DISEASE 04/27/2010  . NONSPECIFIC ABNORMAL FINDING IN STOOL CONTENTS 04/27/2010  . PERSONAL HISTORY OF COLONIC POLYPS 04/27/2010    Past Medical History:  Diagnosis Date  . Allergy   . Arthritis   .  Asthma    oc flare in the past   . Cataract   . Heart murmur   . History of colon polyps   . Hyperlipidemia   . Hypertension   . Myocardial infarction    2 MI--1980's, 1999    Family History  Problem Relation Age of Onset  . Colon cancer Sister   . Colon polyps Neg Hx   . Esophageal cancer Neg Hx   . Rectal cancer Neg Hx   . Stomach cancer Neg Hx    Past Surgical History:  Procedure Laterality Date  . CARDIAC CATHETERIZATION    . CATARACT EXTRACTION Bilateral   . COLONOSCOPY    . CORONARY STENT PLACEMENT    . INGUINAL HERNIA REPAIR Bilateral   . POLYPECTOMY     Social History   Social History Narrative  . No narrative on file     Objective: Vital Signs: BP 138/76   Pulse 66   Resp 16   Ht 6' 2.5" (1.892 m)   Wt 218 lb (98.9 kg)   BMI 27.62 kg/m    Physical Exam  Constitutional: He is oriented to person, place, and time. He appears well-developed and well-nourished.  HENT:  Head: Normocephalic and atraumatic.  Eyes: Conjunctivae and EOM are normal. Pupils are equal, round, and reactive to light.  Neck: Normal range of motion. Neck supple.  Cardiovascular: Normal rate, regular rhythm and normal heart sounds.  Exam reveals  no gallop and no friction rub.   No murmur heard. Pulmonary/Chest: Effort normal and breath sounds normal. No respiratory distress. He has no wheezes. He has no rales. He exhibits no tenderness.  Abdominal: Soft. He exhibits no distension and no mass. There is no tenderness. There is no guarding.  Musculoskeletal: Normal range of motion.  Lymphadenopathy:    He has no cervical adenopathy.  Neurological: He is alert and oriented to person, place, and time. He exhibits normal muscle tone. Coordination normal.  Skin: Skin is warm and dry. Capillary refill takes less than 2 seconds. No rash noted.  Psychiatric: He has a normal mood and affect. His behavior is normal. Judgment and thought content normal.  Nursing note and vitals reviewed.     Musculoskeletal Exam:  Full range of motion of all joints Grip strength is equal and strong bilaterally Fiber myalgia tender points are all absent Morning stiffness for 5 minutes  CDAI Exam: No CDAI exam completed.  No synovitis on examination. Has nodulosis on the left first DIP joint and the left third PIP joint. Also has nodulosis on the left second PIP joint on the palmar aspect.  Investigation: No additional findings. Orders Only on 08/04/2016  Component Date Value Ref Range Status  . WBC 08/04/2016 5.7  3.4 - 10.8 x10E3/uL Final  . RBC 08/04/2016 4.28  4.14 - 5.80 x10E6/uL Final  . Hemoglobin 08/04/2016 14.0  12.6 - 17.7 g/dL Final  . Hematocrit 08/04/2016 41.9  37.5 - 51.0 % Final  . MCV 08/04/2016 98* 79 - 97 fL Final  . MCH 08/04/2016 32.7  26.6 - 33.0 pg Final  . MCHC 08/04/2016 33.4  31.5 - 35.7 g/dL Final  . RDW 08/04/2016 13.9  12.3 - 15.4 % Final  . Platelets 08/04/2016 151  150 - 379 x10E3/uL Final  . Neutrophils 08/04/2016 67  Not Estab. % Final  . Lymphs 08/04/2016 21  Not Estab. % Final  . Monocytes 08/04/2016 9  Not Estab. % Final  . Eos 08/04/2016 3  Not Estab. % Final  . Basos 08/04/2016 0  Not Estab. % Final  . Neutrophils Absolute 08/04/2016 3.8  1.4 - 7.0 x10E3/uL Final  . Lymphocytes Absolute 08/04/2016 1.2  0.7 - 3.1 x10E3/uL Final  . Monocytes Absolute 08/04/2016 0.5  0.1 - 0.9 x10E3/uL Final  . EOS (ABSOLUTE) 08/04/2016 0.2  0.0 - 0.4 x10E3/uL Final  . Basophils Absolute 08/04/2016 0.0  0.0 - 0.2 x10E3/uL Final  . Immature Granulocytes 08/04/2016 0  Not Estab. % Final  . Immature Grans (Abs) 08/04/2016 0.0  0.0 - 0.1 x10E3/uL Final  . Glucose 08/04/2016 70  65 - 99 mg/dL Final  . BUN 08/04/2016 13  8 - 27 mg/dL Final  . Creatinine, Ser 08/04/2016 1.07  0.76 - 1.27 mg/dL Final  . GFR calc non Af Amer 08/04/2016 67  >59 mL/min/1.73 Final  . GFR calc Af Amer 08/04/2016 78  >59 mL/min/1.73 Final  . BUN/Creatinine Ratio 08/04/2016 12  10 - 24 Final   . Sodium 08/04/2016 143  134 - 144 mmol/L Final  . Potassium 08/04/2016 4.3  3.5 - 5.2 mmol/L Final  . Chloride 08/04/2016 102  96 - 106 mmol/L Final  . CO2 08/04/2016 28  18 - 29 mmol/L Final  . Calcium 08/04/2016 9.5  8.6 - 10.2 mg/dL Final  . Total Protein 08/04/2016 6.4  6.0 - 8.5 g/dL Final  . Albumin 08/04/2016 4.0  3.5 - 4.8 g/dL Final  . Globulin, Total  08/04/2016 2.4  1.5 - 4.5 g/dL Final  . Albumin/Globulin Ratio 08/04/2016 1.7  1.2 - 2.2 Final  . Bilirubin Total 08/04/2016 0.6  0.0 - 1.2 mg/dL Final  . Alkaline Phosphatase 08/04/2016 106  39 - 117 IU/L Final  . AST 08/04/2016 33  0 - 40 IU/L Final  . ALT 08/04/2016 18  0 - 44 IU/L Final     Imaging: No results found.  Speciality Comments: No specialty comments available.    Procedures:  No procedures performed Allergies: Patient has no known allergies.   Assessment / Plan:     Visit Diagnoses: High risk medications (not anticoagulants) long-term use - Oct 24, 2016 ==> MTX 5/wk; folic acid 2 mg daily (adequate response). - Plan: CBC with Differential/Platelet, COMPLETE METABOLIC PANEL WITH GFR  Rheumatoid arthritis with rheumatoid factor of multiple sites without organ or systems involvement (HCC)  Rheumatoid nodulosis with arthritis (HCC) - left hand (1st dip; 2nd pip (palmar); 3rd dorsal dip;  Bilateral chronic knee pain   Plan: #1: Rheumatoid arthritis with nodulocystic and positive CCP. Continue methotrexate 5 pills per week. Adequate response Continue folic acid 2 mg every day.  #2: Joint stiffness. Last for about 5 minutes at most. Doing well overall.  #3: Labs are due now. Last labs were November 2017. He will have repeat labs to do in 3 months in [place]. I have placed in order at [facility] through our [facility] system  #4: Knee pain. Does not want Visco supplementation at this time since his deductibles are high. Will let us know when he wants to do Visco supplementation.  #5: Patient has plenty  of methotrexate and folic acid at home at this time. Does not need a refill.    Orders: Orders Placed This Encounter  Procedures  . CBC with Differential/Platelet  . COMPLETE METABOLIC PANEL WITH GFR   Meds ordered this encounter  Medications  . methotrexate (RHEUMATREX) 2.5 MG tablet    Sig: Take 5 tablets (12.5 mg total) by mouth once a week. Caution:Chemotherapy. Protect from light.    Dispense:  60 tablet    Refill:  0    Face-to-face time spent with patient was 30 minutes. 50% of time was spent in counseling and coordination of care.  Follow-Up Instructions: Return in about 5 months (around 03/23/2017) for ra, mtx 5/wk;.   Eliezer Lofts, PA-C  Note - This record has been created using [employer].  Chart creation errors have been sought, but may not always  have been located. Such creation errors do not reflect on  the standard of medical care.

## 2016-10-25 NOTE — Progress Notes (Signed)
Tell patient labs are normal

## 2016-12-26 DIAGNOSIS — N401 Enlarged prostate with lower urinary tract symptoms: Secondary | ICD-10-CM | POA: Diagnosis not present

## 2016-12-26 DIAGNOSIS — R972 Elevated prostate specific antigen [PSA]: Secondary | ICD-10-CM | POA: Diagnosis not present

## 2016-12-26 DIAGNOSIS — N138 Other obstructive and reflux uropathy: Secondary | ICD-10-CM | POA: Diagnosis not present

## 2017-02-21 ENCOUNTER — Other Ambulatory Visit: Payer: Self-pay | Admitting: *Deleted

## 2017-02-21 DIAGNOSIS — Z79899 Other long term (current) drug therapy: Secondary | ICD-10-CM

## 2017-02-22 LAB — CBC WITH DIFFERENTIAL/PLATELET
BASOS ABS: 0 10*3/uL (ref 0.0–0.2)
BASOS: 0 %
EOS (ABSOLUTE): 0.5 10*3/uL — AB (ref 0.0–0.4)
Eos: 8 %
HEMOGLOBIN: 14.1 g/dL (ref 13.0–17.7)
Hematocrit: 42.1 % (ref 37.5–51.0)
IMMATURE GRANULOCYTES: 0 %
Immature Grans (Abs): 0 10*3/uL (ref 0.0–0.1)
LYMPHS: 14 %
Lymphocytes Absolute: 0.9 10*3/uL (ref 0.7–3.1)
MCH: 32.6 pg (ref 26.6–33.0)
MCHC: 33.5 g/dL (ref 31.5–35.7)
MCV: 98 fL — ABNORMAL HIGH (ref 79–97)
MONOCYTES: 12 %
Monocytes Absolute: 0.8 10*3/uL (ref 0.1–0.9)
NEUTROS ABS: 4.1 10*3/uL (ref 1.4–7.0)
NEUTROS PCT: 66 %
PLATELETS: 147 10*3/uL — AB (ref 150–379)
RBC: 4.32 x10E6/uL (ref 4.14–5.80)
RDW: 14.1 % (ref 12.3–15.4)
WBC: 6.3 10*3/uL (ref 3.4–10.8)

## 2017-02-22 LAB — CMP14+EGFR
A/G RATIO: 1.7 (ref 1.2–2.2)
ALT: 17 IU/L (ref 0–44)
AST: 41 IU/L — AB (ref 0–40)
Albumin: 4 g/dL (ref 3.5–4.8)
Alkaline Phosphatase: 107 IU/L (ref 39–117)
BUN/Creatinine Ratio: 13 (ref 10–24)
BUN: 14 mg/dL (ref 8–27)
Bilirubin Total: 0.8 mg/dL (ref 0.0–1.2)
CALCIUM: 9.6 mg/dL (ref 8.6–10.2)
CO2: 24 mmol/L (ref 18–29)
Chloride: 106 mmol/L (ref 96–106)
Creatinine, Ser: 1.09 mg/dL (ref 0.76–1.27)
GFR, EST AFRICAN AMERICAN: 76 mL/min/{1.73_m2} (ref >59)
GFR, EST NON AFRICAN AMERICAN: 66 mL/min/{1.73_m2} (ref >59)
GLUCOSE: 87 mg/dL (ref 65–99)
Globulin, Total: 2.4 g/dL (ref 1.5–4.5)
POTASSIUM: 4.2 mmol/L (ref 3.5–5.2)
Sodium: 145 mmol/L — ABNORMAL HIGH (ref 134–144)
TOTAL PROTEIN: 6.4 g/dL (ref 6.0–8.5)

## 2017-03-01 ENCOUNTER — Telehealth: Payer: Self-pay | Admitting: Radiology

## 2017-03-01 NOTE — Telephone Encounter (Signed)
I have called patient to advise labs are normal  

## 2017-03-01 NOTE — Telephone Encounter (Signed)
-----   Message from [place], [place] sent at 02/22/2017  5:45 PM EDT ----- I am sending copy of these labs to PCP And tell patient #1: CMP with GFR is within normal limits except for sodium slightly elevated at 145 (upper limits of normal is 144) so this is acceptable; and AST is 41 (with normal at 40)  #2: CBC with differential is within normal limits

## 2017-03-30 DIAGNOSIS — H26491 Other secondary cataract, right eye: Secondary | ICD-10-CM | POA: Diagnosis not present

## 2017-03-30 DIAGNOSIS — H353132 Nonexudative age-related macular degeneration, bilateral, intermediate dry stage: Secondary | ICD-10-CM | POA: Diagnosis not present

## 2017-03-30 DIAGNOSIS — D3132 Benign neoplasm of left choroid: Secondary | ICD-10-CM | POA: Diagnosis not present

## 2017-03-30 DIAGNOSIS — Z961 Presence of intraocular lens: Secondary | ICD-10-CM | POA: Diagnosis not present

## 2017-04-10 ENCOUNTER — Ambulatory Visit: Payer: PPO | Admitting: Rheumatology

## 2017-04-12 ENCOUNTER — Ambulatory Visit (INDEPENDENT_AMBULATORY_CARE_PROVIDER_SITE_OTHER): Payer: PPO | Admitting: Rheumatology

## 2017-04-12 ENCOUNTER — Encounter: Payer: Self-pay | Admitting: Rheumatology

## 2017-04-12 VITALS — BP 142/79 | HR 79 | Ht 74.0 in | Wt 213.0 lb

## 2017-04-12 DIAGNOSIS — M069 Rheumatoid arthritis, unspecified: Secondary | ICD-10-CM

## 2017-04-12 DIAGNOSIS — G8929 Other chronic pain: Secondary | ICD-10-CM

## 2017-04-12 DIAGNOSIS — M25562 Pain in left knee: Secondary | ICD-10-CM | POA: Diagnosis not present

## 2017-04-12 DIAGNOSIS — M0579 Rheumatoid arthritis with rheumatoid factor of multiple sites without organ or systems involvement: Secondary | ICD-10-CM | POA: Diagnosis not present

## 2017-04-12 DIAGNOSIS — M25561 Pain in right knee: Secondary | ICD-10-CM

## 2017-04-12 DIAGNOSIS — Z79899 Other long term (current) drug therapy: Secondary | ICD-10-CM

## 2017-04-12 DIAGNOSIS — M0630 Rheumatoid nodule, unspecified site: Secondary | ICD-10-CM

## 2017-04-12 MED ORDER — METHOTREXATE 2.5 MG PO TABS: 12.5000 mg | ORAL_TABLET | ORAL | 0 refills | Status: DC

## 2017-04-12 NOTE — Progress Notes (Signed)
Office Visit Note  Patient: Carlos Patton             Date of Birth: 01-Jan-1941           MRN: [id number]             PCP: Velna Hatchet, MD Referring: Velna Hatchet, MD Visit Date: 04/12/2017 Occupation: '@GUAROCC' @    Subjective:  Follow-up (Arthritis 5 months follow up. Doing well, has occassioanl pain the methotrexate is helpful.)   History of Present Illness: Carlos Patton is a 76 y.o. male  Was last seen in our office on 10/24/2016 for rheumatoid arthritis and high risk prescription) methotrexate 6 per week--> pt went to 5 pills/week and did not notice any difference, no flare, and prefers to stay on 5 pills per week; folic acid 41m qd).   Patient reports that he still doing well with methotrexate 5 pills per week. No joint pain swelling and stiffness. He takes folic acid 2 mg every day.  His knees are not really bother him at this time. He is aware of Visco supplementation and will let [facility] if he is interested.  He reports that his left wrist was injured when he was herding a cow into her stall. We offered him an x-ray but patient declined. He states that it is not that bad.   Activities of Daily Living:  Patient reports morning stiffness for 5 minutes.   Patient Denies nocturnal pain.  Difficulty dressing/grooming: Denies Difficulty climbing stairs: Denies Difficulty getting out of chair: Denies Difficulty using hands for taps, buttons, cutlery, and/or writing: Denies   Review of Systems  Constitutional: Negative for fatigue.  HENT: Negative for mouth sores and mouth dryness.   Eyes: Negative for dryness.  Respiratory: Negative for shortness of breath.   Gastrointestinal: Negative for constipation and diarrhea.  Musculoskeletal: Negative for myalgias and myalgias.  Skin: Negative for sensitivity to sunlight.  Neurological: Negative for memory loss.  Psychiatric/Behavioral: Negative for sleep disturbance.    PMFS History:  Patient Active Problem  List   Diagnosis Date Noted  . CORONARY ARTERY DISEASE 04/27/2010  . NONSPECIFIC ABNORMAL FINDING IN STOOL CONTENTS 04/27/2010  . PERSONAL HISTORY OF COLONIC POLYPS 04/27/2010    Past Medical History:  Diagnosis Date  . Allergy   . Arthritis   . Asthma    oc flare in the past   . Cataract   . Heart murmur   . History of colon polyps   . Hyperlipidemia   . Hypertension   . Myocardial infarction ([place]    2 MI--1980's, 1999    Family History  Problem Relation Age of Onset  . Colon cancer Sister   . Arthritis Mother   . Stroke Father   . Heart attack Father   . Colon polyps Neg Hx   . Esophageal cancer Neg Hx   . Rectal cancer Neg Hx   . Stomach cancer Neg Hx    Past Surgical History:  Procedure Laterality Date  . CARDIAC CATHETERIZATION    . CATARACT EXTRACTION Bilateral   . COLONOSCOPY    . CORONARY STENT PLACEMENT    . INGUINAL HERNIA REPAIR Bilateral   . POLYPECTOMY     Social History   Social History Narrative  . No narrative on file     Objective: Vital Signs: BP (!) 142/79 (BP Location: Left Arm, Patient Position: Sitting, Cuff Size: Normal)   Pulse 79   Ht '6\' 2"'  (1.88 m)   Wt 213  lb (96.6 kg)   BMI 27.35 kg/m    Physical Exam  Constitutional: He is oriented to person, place, and time. He appears well-developed and well-nourished.  HENT:  Head: Normocephalic and atraumatic.  Eyes: Pupils are equal, round, and reactive to light. Conjunctivae and EOM are normal.  Neck: Normal range of motion. Neck supple.  Cardiovascular: Normal rate, regular rhythm and normal heart sounds.  Exam reveals no gallop and no friction rub.   No murmur heard. Pulmonary/Chest: Effort normal and breath sounds normal. No respiratory distress. He has no wheezes. He has no rales. He exhibits no tenderness.  Abdominal: Soft. He exhibits no distension and no mass. There is no tenderness. There is no guarding.  Musculoskeletal: Normal range of motion.  Lymphadenopathy:    He has  no cervical adenopathy.  Neurological: He is alert and oriented to person, place, and time. He exhibits normal muscle tone. Coordination normal.  Skin: Skin is warm and dry. Capillary refill takes less than 2 seconds. No rash noted.  Psychiatric: He has a normal mood and affect. His behavior is normal. Judgment and thought content normal.  Vitals reviewed.    Musculoskeletal Exam:  Full range of motion of all joints Grip strength is equal and strong bilaterally Fibromyalgia tender points are all absent  CDAI Exam: CDAI Homunculus Exam:   Joint Counts:  CDAI Tender Joint count: 0 CDAI Swollen Joint count: 0  Global Assessments:  Patient Global Assessment: 0 Provider Global Assessment: 0    Investigation: No additional findings. Orders Only on 02/21/2017  Component Date Value Ref Range Status  . Glucose 02/21/2017 87  65 - 99 mg/dL Final   Comment: Specimen received in contact with cells. No visible hemolysis present. However GLUC may be decreased and K increased. Clinical correlation indicated.   . BUN 02/21/2017 14  8 - 27 mg/dL Final  . Creatinine, Ser 02/21/2017 1.09  0.76 - 1.27 mg/dL Final  . GFR calc non Af Amer 02/21/2017 66  >59 mL/min/1.73 Final  . GFR calc Af Amer 02/21/2017 76  >59 mL/min/1.73 Final  . BUN/Creatinine Ratio 02/21/2017 13  10 - 24 Final  . Sodium 02/21/2017 145* 134 - 144 mmol/L Final  . Potassium 02/21/2017 4.2  3.5 - 5.2 mmol/L Final   Comment: Specimen received in contact with cells. No visible hemolysis present. However GLUC may be decreased and K increased. Clinical correlation indicated.   . Chloride 02/21/2017 106  96 - 106 mmol/L Final  . CO2 02/21/2017 24  18 - 29 mmol/L Final   Comment: **Effective February 27, 2017 Carbon Dioxide, Total**   reference interval will be changing to:              Age                  Male          Male      0 days   - 30 days         16 - 32        16 - 33     31 days   -  1 year         15 - 25         84 - 25      2 years  -  5 years        76 - 32        17 - 30  6 years  - 12 years        24 - 43        19 - 16                >12 years        2 - 52        20 - 4   . Calcium 02/21/2017 9.6  8.6 - 10.2 mg/dL Final  . Total Protein 02/21/2017 6.4  6.0 - 8.5 g/dL Final  . Albumin 02/21/2017 4.0  3.5 - 4.8 g/dL Final  . Globulin, Total 02/21/2017 2.4  1.5 - 4.5 g/dL Final  . Albumin/Globulin Ratio 02/21/2017 1.7  1.2 - 2.2 Final  . Bilirubin Total 02/21/2017 0.8  0.0 - 1.2 mg/dL Final  . Alkaline Phosphatase 02/21/2017 107  39 - 117 IU/L Final  . AST 02/21/2017 41* 0 - 40 IU/L Final  . ALT 02/21/2017 17  0 - 44 IU/L Final  . WBC 02/21/2017 6.3  3.4 - 10.8 x10E3/uL Final  . RBC 02/21/2017 4.32  4.14 - 5.80 x10E6/uL Final  . Hemoglobin 02/21/2017 14.1  13.0 - 17.7 g/dL Final  . Hematocrit 02/21/2017 42.1  37.5 - 51.0 % Final  . MCV 02/21/2017 98* 79 - 97 fL Final  . MCH 02/21/2017 32.6  26.6 - 33.0 pg Final  . MCHC 02/21/2017 33.5  31.5 - 35.7 g/dL Final  . RDW 02/21/2017 14.1  12.3 - 15.4 % Final  . Platelets 02/21/2017 147* 150 - 379 x10E3/uL Final  . Neutrophils 02/21/2017 66  Not Estab. % Final  . Lymphs 02/21/2017 14  Not Estab. % Final  . Monocytes 02/21/2017 12  Not Estab. % Final  . Eos 02/21/2017 8  Not Estab. % Final  . Basos 02/21/2017 0  Not Estab. % Final  . Neutrophils Absolute 02/21/2017 4.1  1.4 - 7.0 x10E3/uL Final  . Lymphocytes Absolute 02/21/2017 0.9  0.7 - 3.1 x10E3/uL Final  . Monocytes Absolute 02/21/2017 0.8  0.1 - 0.9 x10E3/uL Final  . EOS (ABSOLUTE) 02/21/2017 0.5* 0.0 - 0.4 x10E3/uL Final  . Basophils Absolute 02/21/2017 0.0  0.0 - 0.2 x10E3/uL Final  . Immature Granulocytes 02/21/2017 0  Not Estab. % Final  . Immature Grans (Abs) 02/21/2017 0.0  0.0 - 0.1 x10E3/uL Final  Office Visit on 10/24/2016  Component Date Value Ref Range Status  . WBC 10/24/2016 5.2  3.8 - 10.8 K/uL Final  . RBC 10/24/2016 4.48  4.20 - 5.80 MIL/uL Final  . Hemoglobin  10/24/2016 14.6  13.2 - 17.1 g/dL Final  . HCT 10/24/2016 44.4  38.5 - 50.0 % Final  . MCV 10/24/2016 99.1  80.0 - 100.0 fL Final  . MCH 10/24/2016 32.6  27.0 - 33.0 pg Final  . MCHC 10/24/2016 32.9  32.0 - 36.0 g/dL Final  . RDW 10/24/2016 14.5  11.0 - 15.0 % Final  . Platelets 10/24/2016 148  140 - 400 K/uL Final  . MPV 10/24/2016 11.0  7.5 - 12.5 fL Final  . Neutro Abs 10/24/2016 3172  1,500 - 7,800 cells/uL Final  . Lymphs Abs 10/24/2016 1196  850 - 3,900 cells/uL Final  . Monocytes Absolute 10/24/2016 624  200 - 950 cells/uL Final  . Eosinophils Absolute 10/24/2016 156  15 - 500 cells/uL Final  . Basophils Absolute 10/24/2016 52  0 - 200 cells/uL Final  . Neutrophils Relative % 10/24/2016 61  % Final  . Lymphocytes Relative 10/24/2016 23  % Final  .  Monocytes Relative 10/24/2016 12  % Final  . Eosinophils Relative 10/24/2016 3  % Final  . Basophils Relative 10/24/2016 1  % Final  . Smear Review 10/24/2016 Criteria for review not met   Final  . Sodium 10/24/2016 141  135 - 146 mmol/L Final  . Potassium 10/24/2016 4.2  3.5 - 5.3 mmol/L Final  . Chloride 10/24/2016 106  98 - 110 mmol/L Final  . CO2 10/24/2016 31  20 - 31 mmol/L Final  . Glucose, Bld 10/24/2016 83  65 - 99 mg/dL Final  . BUN 10/24/2016 12  7 - 25 mg/dL Final  . Creat 10/24/2016 0.91  0.70 - 1.18 mg/dL Final   Comment:   For patients > or = 77 years of age: The upper reference limit for Creatinine is approximately 13% higher for people identified as African-American.     . Total Bilirubin 10/24/2016 1.0  0.2 - 1.2 mg/dL Final  . Alkaline Phosphatase 10/24/2016 77  40 - 115 U/L Final  . AST 10/24/2016 27  10 - 35 U/L Final  . ALT 10/24/2016 11  9 - 46 U/L Final  . Total Protein 10/24/2016 6.4  6.1 - 8.1 g/dL Final  . Albumin 10/24/2016 4.1  3.6 - 5.1 g/dL Final  . Calcium 10/24/2016 9.7  8.6 - 10.3 mg/dL Final  . GFR, Est African American 10/24/2016 >89  >=60 mL/min Final  . GFR, Est Non African American  10/24/2016 82  >=60 mL/min Final     Imaging: No results found.  Speciality Comments: No specialty comments available.    Procedures:  No procedures performed Allergies: Patient has no known allergies.   Assessment / Plan:     Visit Diagnoses: Rheumatoid arthritis with rheumatoid factor of multiple sites without organ or systems involvement ([place])  Rheumatoid nodulosis with arthritis ([place])  High risk medications (not anticoagulants) long-term use - Plan: CMP14+EGFR, CBC with Differential/Platelet  Chronic pain of both knees   Plan: #1: Rheumatoid arthritis with nodulosis and positive CCP. Continue methotrexate 5 pills per week. Adequate response Continue folic acid 2 mg every day. Patient is nodulosis on his left second and third finger. The lesions are large. We offered him a cortisone injections but patient declined.  #2: Joint stiffness. Last for about 5 minutes at most. Doing well overall.  #3: Labs are due in 3 months from June 2018. He will have repeat labs to do in 3 months in [place]. I have placed in order at [facility] through our Epic system Labs from June 2018 are within normal limits  #4:Knee pain. Discussed Visco and he does not want Visco supplementation at this time since his deductibles are high.  Will let us know when he wants to do Visco supplementation.  #5: Patient has plenty folic acid at home at this time. Does not need a refill. I sent in a refill for methotrexate to his mail order pharmacy. 5 pills once a week; ninety-day supply with no refill When patient returns to clinic in 5 months, we will see how well he is responding to the lower dose of methotrexate.   Orders: Orders Placed This Encounter  Procedures  . CMP14+EGFR  . CBC with Differential/Platelet   Meds ordered this encounter  Medications  . methotrexate (RHEUMATREX) 2.5 MG tablet    Sig: Take 5 tablets (12.5 mg total) by mouth once a week. Caution:Chemotherapy. Protect  from light.    Dispense:  60 tablet    Refill:  0  Order Specific Question:   Supervising Provider    Answer:   Bo Merino [phone]    Face-to-face time spent with patient was 30 minutes. 50% of time was spent in counseling and coordination of care.  Follow-Up Instructions: Return in about 5 months (around 09/12/2017) for RA,mtx 5/wk,folic 58m qd,knee pain.   NEliezer Lofts PA-C I examined and evaluated the patient with NEliezer LoftsPA. Patient has no synovitis on examination. He will continue current treatment. I offered to inject his rheumatoid nodule but he declined. The plan of care was discussed as noted above.  SBo Merino MD Note - This record has been created using DEditor, commissioning  Chart creation errors have been sought, but may not always  have been located. Such creation errors do not reflect on  the standard of medical care.

## 2017-05-01 DIAGNOSIS — J309 Allergic rhinitis, unspecified: Secondary | ICD-10-CM | POA: Diagnosis not present

## 2017-05-01 DIAGNOSIS — J4530 Mild persistent asthma, uncomplicated: Secondary | ICD-10-CM | POA: Diagnosis not present

## 2017-05-20 DIAGNOSIS — J302 Other seasonal allergic rhinitis: Secondary | ICD-10-CM | POA: Diagnosis not present

## 2017-05-20 DIAGNOSIS — J209 Acute bronchitis, unspecified: Secondary | ICD-10-CM | POA: Diagnosis not present

## 2017-05-23 DIAGNOSIS — J188 Other pneumonia, unspecified organism: Secondary | ICD-10-CM | POA: Diagnosis not present

## 2017-05-23 DIAGNOSIS — R6883 Chills (without fever): Secondary | ICD-10-CM | POA: Diagnosis not present

## 2017-05-23 DIAGNOSIS — R05 Cough: Secondary | ICD-10-CM | POA: Diagnosis not present

## 2017-05-23 DIAGNOSIS — Z6827 Body mass index (BMI) 27.0-27.9, adult: Secondary | ICD-10-CM | POA: Diagnosis not present

## 2017-05-29 ENCOUNTER — Other Ambulatory Visit: Payer: Self-pay | Admitting: Rheumatology

## 2017-05-29 DIAGNOSIS — Z79899 Other long term (current) drug therapy: Secondary | ICD-10-CM | POA: Diagnosis not present

## 2017-05-30 LAB — CBC WITH DIFFERENTIAL/PLATELET
BASOS ABS: 0 10*3/uL (ref 0.0–0.2)
Basos: 0 %
EOS (ABSOLUTE): 0.1 10*3/uL (ref 0.0–0.4)
EOS: 1 %
HEMATOCRIT: 37.7 % (ref 37.5–51.0)
HEMOGLOBIN: 12.6 g/dL — AB (ref 13.0–17.7)
IMMATURE GRANULOCYTES: 1 %
Immature Grans (Abs): 0.1 10*3/uL (ref 0.0–0.1)
LYMPHS ABS: 1 10*3/uL (ref 0.7–3.1)
Lymphs: 12 %
MCH: 32.2 pg (ref 26.6–33.0)
MCHC: 33.4 g/dL (ref 31.5–35.7)
MCV: 96 fL (ref 79–97)
MONOCYTES: 8 %
Monocytes Absolute: 0.6 10*3/uL (ref 0.1–0.9)
NEUTROS PCT: 78 %
Neutrophils Absolute: 6.5 10*3/uL (ref 1.4–7.0)
Platelets: 306 10*3/uL (ref 150–379)
RBC: 3.91 x10E6/uL — AB (ref 4.14–5.80)
RDW: 13.8 % (ref 12.3–15.4)
WBC: 8.3 10*3/uL (ref 3.4–10.8)

## 2017-05-30 LAB — CMP14+EGFR
ALBUMIN: 3.6 g/dL (ref 3.5–4.8)
ALK PHOS: 130 IU/L — AB (ref 39–117)
ALT: 20 IU/L (ref 0–44)
AST: 36 IU/L (ref 0–40)
Albumin/Globulin Ratio: 1.4 (ref 1.2–2.2)
BILIRUBIN TOTAL: 0.2 mg/dL (ref 0.0–1.2)
BUN/Creatinine Ratio: 12 (ref 10–24)
BUN: 11 mg/dL (ref 8–27)
CHLORIDE: 102 mmol/L (ref 96–106)
CO2: 23 mmol/L (ref 20–29)
CREATININE: 0.94 mg/dL (ref 0.76–1.27)
Calcium: 9.3 mg/dL (ref 8.6–10.2)
GFR calc Af Amer: 91 mL/min/{1.73_m2} (ref >59)
GFR calc non Af Amer: 78 mL/min/{1.73_m2} (ref >59)
GLUCOSE: 91 mg/dL (ref 65–99)
Globulin, Total: 2.5 g/dL (ref 1.5–4.5)
Potassium: 4.8 mmol/L (ref 3.5–5.2)
Sodium: 140 mmol/L (ref 134–144)
Total Protein: 6.1 g/dL (ref 6.0–8.5)

## 2017-06-01 NOTE — Progress Notes (Signed)
Labs are stable.

## 2017-07-15 ENCOUNTER — Other Ambulatory Visit: Payer: Self-pay | Admitting: Rheumatology

## 2017-07-17 NOTE — Telephone Encounter (Signed)
Last Visit: 04/12/17 Next Visit: 09/22/17 Labs: 05/29/17 Stable   Okay to refill per Dr. Estanislado Pandy

## 2017-07-18 DIAGNOSIS — R82998 Other abnormal findings in urine: Secondary | ICD-10-CM | POA: Diagnosis not present

## 2017-07-18 DIAGNOSIS — I10 Essential (primary) hypertension: Secondary | ICD-10-CM | POA: Diagnosis not present

## 2017-07-18 DIAGNOSIS — M859 Disorder of bone density and structure, unspecified: Secondary | ICD-10-CM | POA: Diagnosis not present

## 2017-07-18 DIAGNOSIS — E7849 Other hyperlipidemia: Secondary | ICD-10-CM | POA: Diagnosis not present

## 2017-07-25 DIAGNOSIS — E7849 Other hyperlipidemia: Secondary | ICD-10-CM | POA: Diagnosis not present

## 2017-07-25 DIAGNOSIS — Z23 Encounter for immunization: Secondary | ICD-10-CM | POA: Diagnosis not present

## 2017-07-25 DIAGNOSIS — Z0000 Encounter for general adult medical examination without abnormal findings: Secondary | ICD-10-CM | POA: Diagnosis not present

## 2017-07-25 DIAGNOSIS — H9193 Unspecified hearing loss, bilateral: Secondary | ICD-10-CM | POA: Diagnosis not present

## 2017-07-25 DIAGNOSIS — F5221 Male erectile disorder: Secondary | ICD-10-CM | POA: Diagnosis not present

## 2017-07-25 DIAGNOSIS — Z1389 Encounter for screening for other disorder: Secondary | ICD-10-CM | POA: Diagnosis not present

## 2017-07-25 DIAGNOSIS — I25118 Atherosclerotic heart disease of native coronary artery with other forms of angina pectoris: Secondary | ICD-10-CM | POA: Diagnosis not present

## 2017-07-25 DIAGNOSIS — M859 Disorder of bone density and structure, unspecified: Secondary | ICD-10-CM | POA: Diagnosis not present

## 2017-07-25 DIAGNOSIS — E559 Vitamin D deficiency, unspecified: Secondary | ICD-10-CM | POA: Diagnosis not present

## 2017-07-25 DIAGNOSIS — I10 Essential (primary) hypertension: Secondary | ICD-10-CM | POA: Diagnosis not present

## 2017-07-25 DIAGNOSIS — Z6828 Body mass index (BMI) 28.0-28.9, adult: Secondary | ICD-10-CM | POA: Diagnosis not present

## 2017-07-25 DIAGNOSIS — J45998 Other asthma: Secondary | ICD-10-CM | POA: Diagnosis not present

## 2017-07-25 DIAGNOSIS — R972 Elevated prostate specific antigen [PSA]: Secondary | ICD-10-CM | POA: Diagnosis not present

## 2017-07-27 DIAGNOSIS — Z1212 Encounter for screening for malignant neoplasm of rectum: Secondary | ICD-10-CM | POA: Diagnosis not present

## 2017-08-07 DIAGNOSIS — N401 Enlarged prostate with lower urinary tract symptoms: Secondary | ICD-10-CM | POA: Diagnosis not present

## 2017-08-07 DIAGNOSIS — R972 Elevated prostate specific antigen [PSA]: Secondary | ICD-10-CM | POA: Diagnosis not present

## 2017-09-08 NOTE — Progress Notes (Signed)
Office Visit Note  Patient: Carlos Patton             Date of Birth: 1941/08/17           MRN: [id number]             PCP: Velna Hatchet, MD Referring: Velna Hatchet, MD Visit Date: 09/22/2017 Occupation: @GUAROCC @    Subjective:  Medication monitoring   History of Present Illness: Carlos Patton is a 76 y.o. male with history of sero positive rheumatoid arthritis. He states his joints have been doing quite well on methotrexate 5 tablets by mouth every week. He denies any joint swelling. He denies any joint stiffness. He's been tolerating medication without any problems.   Activities of Daily Living:  Patient reports morning stiffness for 0 minute.   Patient Denies nocturnal pain.  Difficulty dressing/grooming: Denies Difficulty climbing stairs: Denies Difficulty getting out of chair: Denies Difficulty using hands for taps, buttons, cutlery, and/or writing: Denies   Review of Systems  Constitutional: Negative for fatigue, night sweats and weakness ( ).  HENT: Negative for mouth sores, mouth dryness and nose dryness.   Eyes: Negative for redness and dryness.  Respiratory: Negative for shortness of breath and difficulty breathing.   Cardiovascular: Negative for chest pain, palpitations, hypertension, irregular heartbeat and swelling in legs/feet.  Gastrointestinal: Negative for constipation and diarrhea.  Endocrine: Negative for increased urination.  Musculoskeletal: Negative for arthralgias, joint pain, joint swelling, myalgias, muscle weakness, morning stiffness, muscle tenderness and myalgias.  Skin: Negative for color change, rash, hair loss, nodules/bumps, skin tightness, ulcers and sensitivity to sunlight.  Allergic/Immunologic: Negative for susceptible to infections.  Neurological: Negative for dizziness, fainting, memory loss and night sweats.  Hematological: Negative for swollen glands.  Psychiatric/Behavioral: Negative for depressed mood and sleep  disturbance. The patient is not nervous/anxious.     PMFS History:  Patient Active Problem List   Diagnosis Date Noted  . CORONARY ARTERY DISEASE 04/27/2010  . NONSPECIFIC ABNORMAL FINDING IN STOOL CONTENTS 04/27/2010  . PERSONAL HISTORY OF COLONIC POLYPS 04/27/2010    Past Medical History:  Diagnosis Date  . Allergy   . Arthritis   . Asthma    oc flare in the past   . Cataract   . Heart murmur   . History of colon polyps   . Hyperlipidemia   . Hypertension   . Myocardial infarction ([place])    2 MI--1980's, 1999    Family History  Problem Relation Age of Onset  . Colon cancer Sister   . Arthritis Mother   . Stroke Father   . Heart attack Father   . Heart Problems Brother   . Heart Problems Brother   . Gout Son   . Colon polyps Neg Hx   . Esophageal cancer Neg Hx   . Rectal cancer Neg Hx   . Stomach cancer Neg Hx    Past Surgical History:  Procedure Laterality Date  . CARDIAC CATHETERIZATION    . CATARACT EXTRACTION Bilateral   . COLONOSCOPY    . CORONARY STENT PLACEMENT    . INGUINAL HERNIA REPAIR Bilateral   . POLYPECTOMY     Social History   Social History Narrative  . Not on file     Objective: Vital Signs: BP 130/68 (BP Location: Right Arm, Patient Position: Sitting, Cuff Size: Normal)   Pulse 76   Resp 17   Ht 6\' 3"  (1.905 m)   Wt 209 lb (94.8 kg)   BMI 26.12  kg/m    Physical Exam  Constitutional: He is oriented to person, place, and time. He appears well-developed and well-nourished.  HENT:  Head: Normocephalic and atraumatic.  Eyes: Conjunctivae and EOM are normal. Pupils are equal, round, and reactive to light.  Neck: Normal range of motion. Neck supple.  Cardiovascular: Normal rate, regular rhythm and normal heart sounds.  Pulmonary/Chest: Effort normal and breath sounds normal.  Abdominal: Soft. Bowel sounds are normal.  Neurological: He is alert and oriented to person, place, and time.  Skin: Skin is warm and dry. Capillary refill  takes less than 2 seconds.  Nodulosis noted on his hands.  Psychiatric: He has a normal mood and affect. His behavior is normal.  Nursing note and vitals reviewed.    Musculoskeletal Exam: C-spine and thoracic lumbar spine good range of motion. Shoulder joints elbow joints wrist joint MCPs PIPs DIPs are good range of motion. He has some thickening of the left wrist joint but no synovitis was noted. Hip joints knee joints ankles MTPs PIPs with good range of motion with no synovitis.  CDAI Exam: CDAI Homunculus Exam:   Joint Counts:  CDAI Tender Joint count: 0 CDAI Swollen Joint count: 0  Global Assessments:  Patient Global Assessment: 1 Provider Global Assessment: 1  CDAI Calculated Score: 2    Investigation: No additional findings. CBC Latest Ref Rng & Units 05/29/2017 02/21/2017 10/24/2016  WBC 3.4 - 10.8 x10E3/uL 8.3 6.3 5.2  Hemoglobin 13.0 - 17.7 g/dL 12.6(L) 14.1 14.6  Hematocrit 37.5 - 51.0 % 37.7 42.1 44.4  Platelets 150 - 379 x10E3/uL 306 147(L) 148   CMP Latest Ref Rng & Units 05/29/2017 02/21/2017 10/24/2016  Glucose 65 - 99 mg/dL 91 87 83  BUN 8 - 27 mg/dL 11 14 12   Creatinine 0.76 - 1.27 mg/dL 0.94 1.09 0.91  Sodium 134 - 144 mmol/L 140 145(H) 141  Potassium 3.5 - 5.2 mmol/L 4.8 4.2 4.2  Chloride 96 - 106 mmol/L 102 106 106  CO2 20 - 29 mmol/L 23 24 31   Calcium 8.6 - 10.2 mg/dL 9.3 9.6 9.7  Total Protein 6.0 - 8.5 g/dL 6.1 6.4 6.4  Total Bilirubin 0.0 - 1.2 mg/dL 0.2 0.8 1.0  Alkaline Phos 39 - 117 IU/L 130(H) 107 77  AST 0 - 40 IU/L 36 41(H) 27  ALT 0 - 44 IU/L 20 17 11     Imaging: No results found.  Speciality Comments: No specialty comments available.    Procedures:  No procedures performed Allergies: Patient has no known allergies.   Assessment / Plan:     Visit Diagnoses: Rheumatoid arthritis of multiple sites with negative rheumatoid factor (H[facility]) - +CCP. He is doing well on methotrexate without any synovitis on examination. I will refill his  methotrexate prescription today.  Rheumatoid nodules [facility]): He has nodulosis on his hands . It is not bothersome to him.  High risk medication use - MTX 5 tablets by mouth every week, folic acid 1 mg by mouth daily - Plan: CBC with Differential/Platelet, COMPLETE METABOLIC PANEL WITH GFR, CBC with Differential/Platelet, COMPLETE METABOLIC PANEL WITH GFR. I will check labs today and then every 3 months to monitor for drug toxicity. His last labs showed some anemia.  History of hypertension: His blood pressure is controlled on current medications.  History of hypercholesterolemia  History of asthma    Orders: Orders Placed This Encounter  Procedures  . CBC with Differential/Platelet  . COMPLETE METABOLIC PANEL WITH GFR  . CBC with Differential/Platelet  .  COMPLETE METABOLIC PANEL WITH GFR   Meds ordered this encounter  Medications  . methotrexate (RHEUMATREX) 2.5 MG tablet    Sig: Take 5 tablets (12.5mg  total) by mouth once a week (Caution: chemotherapy. Protect from light)    Dispense:  60 tablet    Refill:  0    Refill [id number]      Follow-Up Instructions: Return in about 5 months (around 02/20/2018) for Rheumatoid arthritis.   Bo Merino, MD  Note - This record has been created using Editor, commissioning.  Chart creation errors have been sought, but may not always  have been located. Such creation errors do not reflect on  the standard of medical care.

## 2017-09-22 ENCOUNTER — Encounter: Payer: Self-pay | Admitting: Rheumatology

## 2017-09-22 ENCOUNTER — Ambulatory Visit: Payer: PPO | Admitting: Rheumatology

## 2017-09-22 ENCOUNTER — Encounter (INDEPENDENT_AMBULATORY_CARE_PROVIDER_SITE_OTHER): Payer: Self-pay

## 2017-09-22 VITALS — BP 130/68 | HR 76 | Resp 17 | Ht 75.0 in | Wt 209.0 lb

## 2017-09-22 DIAGNOSIS — Z79899 Other long term (current) drug therapy: Secondary | ICD-10-CM

## 2017-09-22 DIAGNOSIS — Z8639 Personal history of other endocrine, nutritional and metabolic disease: Secondary | ICD-10-CM | POA: Diagnosis not present

## 2017-09-22 DIAGNOSIS — Z8679 Personal history of other diseases of the circulatory system: Secondary | ICD-10-CM

## 2017-09-22 DIAGNOSIS — Z8709 Personal history of other diseases of the respiratory system: Secondary | ICD-10-CM | POA: Diagnosis not present

## 2017-09-22 DIAGNOSIS — M0630 Rheumatoid nodule, unspecified site: Secondary | ICD-10-CM | POA: Diagnosis not present

## 2017-09-22 DIAGNOSIS — M0609 Rheumatoid arthritis without rheumatoid factor, multiple sites: Secondary | ICD-10-CM | POA: Diagnosis not present

## 2017-09-22 LAB — COMPLETE METABOLIC PANEL WITH GFR
AG RATIO: 1.7 (calc) (ref 1.0–2.5)
ALBUMIN MSPROF: 4 g/dL (ref 3.6–5.1)
ALT: 45 U/L (ref 9–46)
AST: 55 U/L — AB (ref 10–35)
Alkaline phosphatase (APISO): 124 U/L — ABNORMAL HIGH (ref 40–115)
BUN: 15 mg/dL (ref 7–25)
CALCIUM: 9.7 mg/dL (ref 8.6–10.3)
CO2: 30 mmol/L (ref 20–32)
CREATININE: 0.93 mg/dL (ref 0.70–1.18)
Chloride: 105 mmol/L (ref 98–110)
GFR, EST AFRICAN AMERICAN: 91 mL/min/{1.73_m2} (ref >=60)
GFR, EST NON AFRICAN AMERICAN: 79 mL/min/{1.73_m2} (ref >=60)
GLOBULIN: 2.4 g/dL (ref 1.9–3.7)
Glucose, Bld: 96 mg/dL (ref 65–99)
POTASSIUM: 4.6 mmol/L (ref 3.5–5.3)
Sodium: 139 mmol/L (ref 135–146)
TOTAL PROTEIN: 6.4 g/dL (ref 6.1–8.1)
Total Bilirubin: 0.7 mg/dL (ref 0.2–1.2)

## 2017-09-22 LAB — CBC WITH DIFFERENTIAL/PLATELET
Basophils Absolute: 49 cells/uL (ref 0–200)
Basophils Relative: 0.7 %
Eosinophils Absolute: 266 cells/uL (ref 15–500)
Eosinophils Relative: 3.8 %
HEMATOCRIT: 42.3 % (ref 38.5–50.0)
HEMOGLOBIN: 14.3 g/dL (ref 13.2–17.1)
LYMPHS ABS: 1162 {cells}/uL (ref 850–3900)
MCH: 32.2 pg (ref 27.0–33.0)
MCHC: 33.8 g/dL (ref 32.0–36.0)
MCV: 95.3 fL (ref 80.0–100.0)
MPV: 11.6 fL (ref 7.5–12.5)
Monocytes Relative: 8.7 %
NEUTROS ABS: 4914 {cells}/uL (ref 1500–7800)
Neutrophils Relative %: 70.2 %
Platelets: 154 10*3/uL (ref 140–400)
RBC: 4.44 10*6/uL (ref 4.20–5.80)
RDW: 12.7 % (ref 11.0–15.0)
Total Lymphocyte: 16.6 %
WBC: 7 10*3/uL (ref 3.8–10.8)
WBCMIX: 609 {cells}/uL (ref 200–950)

## 2017-09-22 MED ORDER — METHOTREXATE 2.5 MG PO TABS: ORAL_TABLET | ORAL | 0 refills | Status: DC

## 2017-09-22 NOTE — Patient Instructions (Signed)
Standing Labs We placed an order today for your standing lab work.    Please come back and get your standing labs in April and every 3 months  We have open lab Monday through Friday from 8:30-11:30 AM and 1:30-4 PM at the office of Dr. Franchot Pollitt.   The office is located at [address], [address], [place], [place] [address] No appointment is necessary.   Labs are drawn by [facility].  You may receive a bill from [facility] for your lab work. If you have any questions regarding directions or hours of operation,  please call [phone].    

## 2017-09-25 NOTE — Progress Notes (Signed)
LFTs are mildly elevated. We will continue to monitor. These advise patient to avoid all NSAIDs and alcohol

## 2017-11-06 DIAGNOSIS — L821 Other seborrheic keratosis: Secondary | ICD-10-CM | POA: Diagnosis not present

## 2017-12-18 ENCOUNTER — Other Ambulatory Visit: Payer: Self-pay

## 2017-12-18 DIAGNOSIS — Z79899 Other long term (current) drug therapy: Secondary | ICD-10-CM

## 2017-12-19 LAB — CBC WITH DIFFERENTIAL/PLATELET
BASOS ABS: 28 {cells}/uL (ref 0–200)
Basophils Relative: 0.5 %
EOS ABS: 308 {cells}/uL (ref 15–500)
Eosinophils Relative: 5.5 %
HEMATOCRIT: 40.3 % (ref 38.5–50.0)
Hemoglobin: 14.1 g/dL (ref 13.2–17.1)
LYMPHS ABS: 846 {cells}/uL — AB (ref 850–3900)
MCH: 33.7 pg — AB (ref 27.0–33.0)
MCHC: 35 g/dL (ref 32.0–36.0)
MCV: 96.4 fL (ref 80.0–100.0)
MPV: 11.5 fL (ref 7.5–12.5)
Monocytes Relative: 11.6 %
Neutro Abs: 3769 cells/uL (ref 1500–7800)
Neutrophils Relative %: 67.3 %
Platelets: 151 10*3/uL (ref 140–400)
RBC: 4.18 10*6/uL — AB (ref 4.20–5.80)
RDW: 12.7 % (ref 11.0–15.0)
TOTAL LYMPHOCYTE: 15.1 %
WBC: 5.6 10*3/uL (ref 3.8–10.8)
WBCMIX: 650 {cells}/uL (ref 200–950)

## 2017-12-19 LAB — COMPLETE METABOLIC PANEL WITHOUT GFR
AG Ratio: 1.8 (calc) (ref 1.0–2.5)
ALT: 10 U/L (ref 9–46)
AST: 26 U/L (ref 10–35)
Albumin: 4 g/dL (ref 3.6–5.1)
Alkaline phosphatase (APISO): 88 U/L (ref 40–115)
BUN: 16 mg/dL (ref 7–25)
CO2: 33 mmol/L — ABNORMAL HIGH (ref 20–32)
Calcium: 9.6 mg/dL (ref 8.6–10.3)
Chloride: 107 mmol/L (ref 98–110)
Creat: 1.12 mg/dL (ref 0.70–1.18)
GFR, Est African American: 73 mL/min/1.73m2
GFR, Est Non African American: 63 mL/min/1.73m2
Globulin: 2.2 g/dL (ref 1.9–3.7)
Glucose, Bld: 74 mg/dL (ref 65–99)
Potassium: 4.3 mmol/L (ref 3.5–5.3)
Sodium: 145 mmol/L (ref 135–146)
Total Bilirubin: 0.6 mg/dL (ref 0.2–1.2)
Total Protein: 6.2 g/dL (ref 6.1–8.1)

## 2017-12-19 NOTE — Progress Notes (Signed)
Labs are stable.

## 2018-01-01 DIAGNOSIS — H52203 Unspecified astigmatism, bilateral: Secondary | ICD-10-CM | POA: Diagnosis not present

## 2018-01-01 DIAGNOSIS — H02403 Unspecified ptosis of bilateral eyelids: Secondary | ICD-10-CM | POA: Diagnosis not present

## 2018-01-01 DIAGNOSIS — D3132 Benign neoplasm of left choroid: Secondary | ICD-10-CM | POA: Diagnosis not present

## 2018-01-01 DIAGNOSIS — H353132 Nonexudative age-related macular degeneration, bilateral, intermediate dry stage: Secondary | ICD-10-CM | POA: Diagnosis not present

## 2018-01-08 ENCOUNTER — Telehealth: Payer: Self-pay | Admitting: Rheumatology

## 2018-01-08 MED ORDER — METHOTREXATE 2.5 MG PO TABS: ORAL_TABLET | ORAL | 0 refills | Status: DC

## 2018-01-08 NOTE — Telephone Encounter (Signed)
Patient's wife called requesting prescription refill of Methotrexate.  Patient's pharmacy is [facility].

## 2018-01-08 NOTE — Telephone Encounter (Signed)
Last Visit: 09/22/17 Next visit: 03/07/18 Labs: 12/18/17 stable  Okay to refill per Dr. Estanislado Pandy

## 2018-02-05 DIAGNOSIS — N401 Enlarged prostate with lower urinary tract symptoms: Secondary | ICD-10-CM | POA: Diagnosis not present

## 2018-02-05 DIAGNOSIS — N138 Other obstructive and reflux uropathy: Secondary | ICD-10-CM | POA: Diagnosis not present

## 2018-02-05 DIAGNOSIS — R972 Elevated prostate specific antigen [PSA]: Secondary | ICD-10-CM | POA: Diagnosis not present

## 2018-02-21 NOTE — Progress Notes (Signed)
Office Visit Note  Patient: Carlos Patton             Date of Birth: 07-20-1941           MRN: [id number]             PCP: Velna Hatchet, MD Referring: Velna Hatchet, MD Visit Date: 03/07/2018 Occupation: @GUAROCC @    Subjective:  Medication management.   History of Present Illness: Carlos Patton is a 77 y.o. male with history of rheumatoid arthritis and rheumatoid nodulosis.  He states he has been doing quite well on methotrexate 5 tablets/week.  He denies any joint swelling or joint stiffness.  He has been tolerating medication well.  Activities of Daily Living:  Patient reports morning stiffness for 5 minutes.   Patient Denies nocturnal pain.  Difficulty dressing/grooming: Denies Difficulty climbing stairs: Denies Difficulty getting out of chair: Denies Difficulty using hands for taps, buttons, cutlery, and/or writing: Denies   Review of Systems  Constitutional: Negative for fatigue and night sweats.  HENT: Negative for mouth sores, mouth dryness and nose dryness.   Eyes: Negative for redness and dryness.  Respiratory: Negative for shortness of breath and difficulty breathing.   Cardiovascular: Negative for chest pain, palpitations, hypertension, irregular heartbeat and swelling in legs/feet.  Gastrointestinal: Negative for abdominal pain, constipation and diarrhea.  Endocrine: Negative for increased urination.  Genitourinary: Negative for pelvic pain.  Musculoskeletal: Positive for arthralgias, joint pain and morning stiffness. Negative for joint swelling, myalgias, muscle weakness, muscle tenderness and myalgias.  Skin: Negative for color change, rash, hair loss, nodules/bumps, skin tightness, ulcers and sensitivity to sunlight.  Allergic/Immunologic: Negative for susceptible to infections.  Neurological: Negative for dizziness, fainting, light-headedness, headaches, memory loss, night sweats and weakness.  Hematological: Positive for bruising/bleeding  tendency. Negative for swollen glands.  Psychiatric/Behavioral: Negative for depressed mood, confusion and sleep disturbance. The patient is not nervous/anxious.     PMFS History:  Patient Active Problem List   Diagnosis Date Noted  . Rheumatoid arthritis of multiple sites with negative rheumatoid factor ([place]) 03/07/2018  . Rheumatoid nodules ([place]) 03/07/2018  . High risk medication use 03/07/2018  . History of asthma 03/07/2018  . History of hypertension 03/07/2018  . History of hypercholesterolemia 03/07/2018  . History of coronary artery disease 03/07/2018  . CORONARY ARTERY DISEASE 04/27/2010  . NONSPECIFIC ABNORMAL FINDING IN STOOL CONTENTS 04/27/2010  . PERSONAL HISTORY OF COLONIC POLYPS 04/27/2010    Past Medical History:  Diagnosis Date  . Allergy   . Arthritis   . Asthma    oc flare in the past   . Cataract   . Heart murmur   . History of colon polyps   . Hyperlipidemia   . Hypertension   . Myocardial infarction ([place])    2 MI--1980's, 1999    Family History  Problem Relation Age of Onset  . Colon cancer Sister   . Arthritis Mother   . Stroke Father   . Heart attack Father   . Heart Problems Brother   . Heart Problems Brother   . Gout Son   . Colon polyps Neg Hx   . Esophageal cancer Neg Hx   . Rectal cancer Neg Hx   . Stomach cancer Neg Hx    Past Surgical History:  Procedure Laterality Date  . CARDIAC CATHETERIZATION    . CATARACT EXTRACTION Bilateral   . COLONOSCOPY    . CORONARY STENT PLACEMENT    . INGUINAL HERNIA REPAIR Bilateral   .  POLYPECTOMY     Social History   Social History Narrative  . Not on file     Objective: Vital Signs: BP 135/81 (BP Location: Left Arm, Patient Position: Sitting, Cuff Size: Normal)   Pulse (!) 54   Resp 16   Ht 6\' 2"  (1.88 m)   Wt 212 lb (96.2 kg)   BMI 27.22 kg/m    Physical Exam  Constitutional: He is oriented to person, place, and time. He appears well-developed and well-nourished.  HENT:  Head:  Normocephalic and atraumatic.  Eyes: Pupils are equal, round, and reactive to light. Conjunctivae and EOM are normal.  Neck: Normal range of motion. Neck supple.  Cardiovascular: Normal rate, regular rhythm and normal heart sounds.  Pulmonary/Chest: Effort normal and breath sounds normal.  Abdominal: Soft. Bowel sounds are normal.  Neurological: He is alert and oriented to person, place, and time.  Skin: Skin is warm and dry. Capillary refill takes less than 2 seconds.  Psychiatric: He has a normal mood and affect. His behavior is normal.  Nursing note and vitals reviewed.    Musculoskeletal Exam: C-spine thoracic lumbar spine good range of motion.  Shoulder joints elbow joints wrist joints are in good range of motion.  He had no synovitis of her wrist joints MCPs PIPs or DIPs.  He has some DIP PIP thickening in his hands.  Rheumatoid nodulosis was noted on his hands.  Hip joints knee joints ankles MTPs PIPs were in good range of motion with no synovitis.  CDAI Exam: CDAI Homunculus Exam:   Joint Counts:  CDAI Tender Joint count: 0 CDAI Swollen Joint count: 0  Global Assessments:  Patient Global Assessment: 4 Provider Global Assessment: 2  CDAI Calculated Score: 6    Investigation: No additional findings. CBC Latest Ref Rng & Units 12/18/2017 09/22/2017 05/29/2017  WBC 3.8 - 10.8 Thousand/uL 5.6 7.0 8.3  Hemoglobin 13.2 - 17.1 g/dL 14.1 14.3 12.6(L)  Hematocrit 38.5 - 50.0 % 40.3 42.3 37.7  Platelets 140 - 400 Thousand/uL 151 154 306   CMP Latest Ref Rng & Units 12/18/2017 09/22/2017 05/29/2017  Glucose 65 - 99 mg/dL 74 96 91  BUN 7 - 25 mg/dL 16 15 11   Creatinine 0.70 - 1.18 mg/dL 1.12 0.93 0.94  Sodium 135 - 146 mmol/L 145 139 140  Potassium 3.5 - 5.3 mmol/L 4.3 4.6 4.8  Chloride 98 - 110 mmol/L 107 105 102  CO2 20 - 32 mmol/L 33(H) 30 23  Calcium 8.6 - 10.3 mg/dL 9.6 9.7 9.3  Total Protein 6.1 - 8.1 g/dL 6.2 6.4 6.1  Total Bilirubin 0.2 - 1.2 mg/dL 0.6 0.7 0.2  Alkaline Phos  39 - 117 IU/L - - 130(H)  AST 10 - 35 U/L 26 55(H) 36  ALT 9 - 46 U/L 10 45 20    Imaging: No results found.  Speciality Comments: No specialty comments available.    Procedures:  No procedures performed Allergies: Patient has no known allergies.   Assessment / Plan:     Visit Diagnoses: Rheumatoid arthritis of multiple sites with negative rheumatoid factor (HCC) - +CCP with nodulosis.  He does not have any synovitis on examination.  He has been doing well on methotrexate 5 tablets/week.  Rheumatoid nodules (HCC) - He has nodulosis on his hands is unchanged.  High risk medication use - MTX 5 tablets by mouth every week, folic acid 1 mg by mouth daily - Plan: CBC with Differential/Platelet, COMPLETE METABOLIC PANEL WITH GFR today and then every  3 months to monitor for drug toxicity.  History of asthma-currently asymptomatic.  History of hypertension-his blood pressure is well controlled.  History of hypercholesterolemia-he has been taking medications.  History of coronary artery disease - Status post a stent placement   Association of heart disease with rheumatoid arthritis was discussed. Need to monitor blood pressure, cholesterol, and to exercise 30-60 minutes on daily basis was discussed. Poor dental hygiene can be a predisposing factor for rheumatoid arthritis. Good dental hygiene was discussed.  Patient reports he had a DEXA scan last October which showed osteopenia.  He was advised to take calcium  by his PCP.  I do not have that report available.   Orders: Orders Placed This Encounter  Procedures  . CBC with Differential/Platelet  . COMPLETE METABOLIC PANEL WITH GFR   Meds ordered this encounter  Medications  . methotrexate (RHEUMATREX) 2.5 MG tablet    Sig: Take 5 tablets (12.5mg  total) by mouth once a week (Caution: chemotherapy. Protect from light)    Dispense:  60 tablet    Refill:  0    Refill [id number]    Face-to-face time spent with patient was 30  minutes. >50% of time was spent in counseling and coordination of care.  Follow-Up Instructions: Return in about 5 months (around 08/07/2018) for Rheumatoid arthritis.   Bo Merino, MD  Note - This record has been created using Editor, commissioning.  Chart creation errors have been sought, but may not always  have been located. Such creation errors do not reflect on  the standard of medical care.

## 2018-03-07 ENCOUNTER — Encounter: Payer: Self-pay | Admitting: Rheumatology

## 2018-03-07 ENCOUNTER — Ambulatory Visit: Payer: PPO | Admitting: Rheumatology

## 2018-03-07 VITALS — BP 135/81 | HR 54 | Resp 16 | Ht 74.0 in | Wt 212.0 lb

## 2018-03-07 DIAGNOSIS — M0609 Rheumatoid arthritis without rheumatoid factor, multiple sites: Secondary | ICD-10-CM

## 2018-03-07 DIAGNOSIS — Z8639 Personal history of other endocrine, nutritional and metabolic disease: Secondary | ICD-10-CM | POA: Diagnosis not present

## 2018-03-07 DIAGNOSIS — Z8709 Personal history of other diseases of the respiratory system: Secondary | ICD-10-CM | POA: Insufficient documentation

## 2018-03-07 DIAGNOSIS — Z79899 Other long term (current) drug therapy: Secondary | ICD-10-CM

## 2018-03-07 DIAGNOSIS — Z8679 Personal history of other diseases of the circulatory system: Secondary | ICD-10-CM | POA: Diagnosis not present

## 2018-03-07 DIAGNOSIS — M0630 Rheumatoid nodule, unspecified site: Secondary | ICD-10-CM

## 2018-03-07 MED ORDER — METHOTREXATE 2.5 MG PO TABS: ORAL_TABLET | ORAL | 0 refills | Status: DC

## 2018-03-07 NOTE — Patient Instructions (Signed)
Standing Labs We placed an order today for your standing lab work.    Please come back and get your standing labs in September and every 3 months   We have open lab Monday through Friday from 8:30-11:30 AM and 1:30-4:00 PM  at the office of Dr. Jsaon Yoo.   You may experience shorter wait times on Monday and Friday afternoons. The office is located at [address], [address], [place], [place] [address] No appointment is necessary.   Labs are drawn by [facility].  You may receive a bill from [facility] for your lab work. If you have any questions regarding directions or hours of operation,  please call [phone].    

## 2018-06-07 ENCOUNTER — Other Ambulatory Visit: Payer: Self-pay

## 2018-06-07 DIAGNOSIS — Z79899 Other long term (current) drug therapy: Secondary | ICD-10-CM

## 2018-06-07 DIAGNOSIS — M25551 Pain in right hip: Secondary | ICD-10-CM | POA: Diagnosis not present

## 2018-06-07 DIAGNOSIS — Z6828 Body mass index (BMI) 28.0-28.9, adult: Secondary | ICD-10-CM | POA: Diagnosis not present

## 2018-06-07 LAB — COMPLETE METABOLIC PANEL WITH GFR
AG Ratio: 2.1 (calc) (ref 1.0–2.5)
ALBUMIN MSPROF: 4.1 g/dL (ref 3.6–5.1)
ALT: 13 U/L (ref 9–46)
AST: 26 U/L (ref 10–35)
Alkaline phosphatase (APISO): 85 U/L (ref 40–115)
BUN: 12 mg/dL (ref 7–25)
CALCIUM: 9.2 mg/dL (ref 8.6–10.3)
CO2: 27 mmol/L (ref 20–32)
CREATININE: 0.92 mg/dL (ref 0.70–1.18)
Chloride: 105 mmol/L (ref 98–110)
GFR, EST NON AFRICAN AMERICAN: 80 mL/min/{1.73_m2} (ref >=60)
GFR, Est African American: 93 mL/min/{1.73_m2} (ref >=60)
GLOBULIN: 2 g/dL (ref 1.9–3.7)
Glucose, Bld: 146 mg/dL — ABNORMAL HIGH (ref 65–99)
Potassium: 3.8 mmol/L (ref 3.5–5.3)
SODIUM: 141 mmol/L (ref 135–146)
Total Bilirubin: 0.9 mg/dL (ref 0.2–1.2)
Total Protein: 6.1 g/dL (ref 6.1–8.1)

## 2018-06-07 LAB — CBC WITH DIFFERENTIAL/PLATELET
Basophils Absolute: 22 cells/uL (ref 0–200)
Basophils Relative: 0.5 %
EOS ABS: 163 {cells}/uL (ref 15–500)
Eosinophils Relative: 3.7 %
HEMATOCRIT: 40.7 % (ref 38.5–50.0)
Hemoglobin: 13.6 g/dL (ref 13.2–17.1)
LYMPHS ABS: 845 {cells}/uL — AB (ref 850–3900)
MCH: 32.7 pg (ref 27.0–33.0)
MCHC: 33.4 g/dL (ref 32.0–36.0)
MCV: 97.8 fL (ref 80.0–100.0)
MPV: 12.2 fL (ref 7.5–12.5)
Monocytes Relative: 10.3 %
NEUTROS PCT: 66.3 %
Neutro Abs: 2917 cells/uL (ref 1500–7800)
PLATELETS: 129 10*3/uL — AB (ref 140–400)
RBC: 4.16 10*6/uL — ABNORMAL LOW (ref 4.20–5.80)
RDW: 12.7 % (ref 11.0–15.0)
TOTAL LYMPHOCYTE: 19.2 %
WBC: 4.4 10*3/uL (ref 3.8–10.8)
WBCMIX: 453 {cells}/uL (ref 200–950)

## 2018-06-08 NOTE — Progress Notes (Signed)
Platelets are low.  We will continue to monitor.  Please repeat labs in 3 months.

## 2018-06-19 ENCOUNTER — Ambulatory Visit (INDEPENDENT_AMBULATORY_CARE_PROVIDER_SITE_OTHER): Payer: PPO | Admitting: Orthopaedic Surgery

## 2018-06-19 LAB — COMPLETE METABOLIC PANEL WITH GFR
AG RATIO: 1.8 (calc) (ref 1.0–2.5)
ALT: 12 U/L (ref 9–46)
AST: 26 U/L (ref 10–35)
Albumin: 4.1 g/dL (ref 3.6–5.1)
Alkaline phosphatase (APISO): 102 U/L (ref 40–115)
BUN: 15 mg/dL (ref 7–25)
CO2: 31 mmol/L (ref 20–32)
CREATININE: 0.99 mg/dL (ref 0.70–1.18)
Calcium: 9.5 mg/dL (ref 8.6–10.3)
Chloride: 106 mmol/L (ref 98–110)
GFR, EST AFRICAN AMERICAN: 85 mL/min/{1.73_m2} (ref >=60)
GFR, EST NON AFRICAN AMERICAN: 73 mL/min/{1.73_m2} (ref >=60)
GLOBULIN: 2.3 g/dL (ref 1.9–3.7)
Glucose, Bld: 70 mg/dL (ref 65–99)
POTASSIUM: 4.2 mmol/L (ref 3.5–5.3)
SODIUM: 140 mmol/L (ref 135–146)
TOTAL PROTEIN: 6.4 g/dL (ref 6.1–8.1)
Total Bilirubin: 0.7 mg/dL (ref 0.2–1.2)

## 2018-06-19 LAB — CBC WITH DIFFERENTIAL/PLATELET
BASOS ABS: 42 {cells}/uL (ref 0–200)
Basophils Relative: 0.8 %
EOS ABS: 281 {cells}/uL (ref 15–500)
EOS PCT: 5.3 %
HEMATOCRIT: 41.3 % (ref 38.5–50.0)
HEMOGLOBIN: 14.2 g/dL (ref 13.2–17.1)
LYMPHS ABS: 1124 {cells}/uL (ref 850–3900)
MCH: 33.1 pg — ABNORMAL HIGH (ref 27.0–33.0)
MCHC: 34.4 g/dL (ref 32.0–36.0)
MCV: 96.3 fL (ref 80.0–100.0)
MPV: 11.5 fL (ref 7.5–12.5)
Monocytes Relative: 10.3 %
NEUTROS ABS: 3307 {cells}/uL (ref 1500–7800)
Neutrophils Relative %: 62.4 %
Platelets: 141 10*3/uL (ref 140–400)
RBC: 4.29 10*6/uL (ref 4.20–5.80)
RDW: 13 % (ref 11.0–15.0)
Total Lymphocyte: 21.2 %
WBC: 5.3 10*3/uL (ref 3.8–10.8)
WBCMIX: 546 {cells}/uL (ref 200–950)

## 2018-06-22 ENCOUNTER — Encounter (INDEPENDENT_AMBULATORY_CARE_PROVIDER_SITE_OTHER): Payer: Self-pay | Admitting: Orthopaedic Surgery

## 2018-06-22 ENCOUNTER — Ambulatory Visit (INDEPENDENT_AMBULATORY_CARE_PROVIDER_SITE_OTHER): Payer: PPO

## 2018-06-22 ENCOUNTER — Other Ambulatory Visit (INDEPENDENT_AMBULATORY_CARE_PROVIDER_SITE_OTHER): Payer: Self-pay | Admitting: Radiology

## 2018-06-22 ENCOUNTER — Ambulatory Visit (INDEPENDENT_AMBULATORY_CARE_PROVIDER_SITE_OTHER): Payer: Self-pay

## 2018-06-22 ENCOUNTER — Ambulatory Visit (INDEPENDENT_AMBULATORY_CARE_PROVIDER_SITE_OTHER): Payer: PPO | Admitting: Orthopaedic Surgery

## 2018-06-22 VITALS — BP 139/84 | HR 57 | Ht 74.0 in | Wt 212.0 lb

## 2018-06-22 DIAGNOSIS — M5441 Lumbago with sciatica, right side: Secondary | ICD-10-CM

## 2018-06-22 DIAGNOSIS — G8929 Other chronic pain: Secondary | ICD-10-CM

## 2018-06-22 DIAGNOSIS — S0502XA Injury of conjunctiva and corneal abrasion without foreign body, left eye, initial encounter: Secondary | ICD-10-CM | POA: Diagnosis not present

## 2018-06-22 DIAGNOSIS — M25551 Pain in right hip: Secondary | ICD-10-CM

## 2018-06-22 NOTE — Progress Notes (Signed)
Office Visit Note   Patient: Carlos Patton           Date of Birth: [birth year]           MRN: [id number] Visit Date: 06/22/2018              Requested by: Velna Hatchet, MD [address] [place], [place] [address] PCP: Velna Hatchet, MD   Assessment & Plan: Visit Diagnoses:  1. Pain in right hip   2. Chronic midline low back pain with right-sided sciatica     Plan: Degenerative arthrosis lumbar spine with probable stenosis.  Long discussion with Mr. Mrs. Patton regarding the problem.  I think it is worth obtaining an MRI scan as he may be a candidate for epidural steroid injection.  Office time over 45 minutes 50% counseling regarding diagnosis and treatment options pending results of the MRI scan  Follow-Up Instructions: Return after MRI L-S spine.   Orders:  Orders Placed This Encounter  Procedures  . XR Pelvis 1-2 Views  . XR Lumbar Spine 2-3 Views   No orders of the defined types were placed in this encounter.     Procedures: No procedures performed   Clinical Data: No additional findings.   Subjective: Chief Complaint  Patient presents with  . New Patient (Initial Visit)    R HIP PAIN FOR 2-3 MO JUST GETTING WORSE RADIATES DOWN OUTTER SIDE OF LEG TO KNEE AND FOOT  Carlos Patton is a 77 years old accompanied by his wife and here for evaluation of right lower extremity pain.  He has been having trouble approximately 3 to 4 months.  Pain was insidious in onset without history of injury or trauma.  [place] and is active with a tractor and other physical activity.  He oftentimes has pain that is hard to describe that begins somewhere in the area of his right hip and radiates into his thigh and into his leg.  Is not having any numbness or tingling.  Not having any groin pain.  Has not really had any significant back pain.  He has had some trouble off and on for even longer than 4 months but not to the point where it interfered with his activities of  daily living.  He has tried  Tylenol with some relief. HPI  Review of Systems  Constitutional: Negative for fatigue and fever.  HENT: Negative for ear pain.   Eyes: Negative for pain.  Respiratory: Negative for cough and shortness of breath.   Cardiovascular: Negative for leg swelling.  Gastrointestinal: Negative for constipation and diarrhea.  Genitourinary: Negative for difficulty urinating.  Musculoskeletal: Negative for back pain and neck pain.  Skin: Negative for rash.  Allergic/Immunologic: Negative for food allergies.  Neurological: Positive for weakness. Negative for numbness.  Hematological: Bruises/bleeds easily.  Psychiatric/Behavioral: Negative for sleep disturbance.     Objective: Vital Signs: BP 139/84 (BP Location: Right Arm, Patient Position: Sitting, Cuff Size: Normal)   Pulse (!) 57   Ht 6\' 2"  (1.88 m)   Wt 212 lb (96.2 kg)   BMI 27.22 kg/m   Physical Exam  Constitutional: He is oriented to person, place, and time. He appears well-developed and well-nourished.  HENT:  Mouth/Throat: Oropharynx is clear and moist.  Eyes: Pupils are equal, round, and reactive to light. EOM are normal.  Pulmonary/Chest: Effort normal.  Neurological: He is alert and oriented to person, place, and time.  Skin: Skin is warm and dry.  Psychiatric:  He has a normal mood and affect. His behavior is normal.    Ortho Exam awake alert and oriented x3.  Comfortable sitting.  Painless range of motion of both hips with internal and external rotation.  No loss of motion.  Straight leg raise negative.  Some mild venous stasis changes distally.  Good pulses.  Motor exam intact.  Does not walk with a limp.  No localized areas of tenderness about the right hip  Specialty Comments:  No specialty comments available.  Imaging: Xr Lumbar Spine 2-3 Views  Result Date: 06/22/2018 Films of lumbar spine were obtained in the AP and lateral projection.  There is a considerable degenerative scoliosis  of the right lateral film reveals an anterior listhesis about 25% of L5 on S1.  There is a vacuum disc phenomenon at L5-S1 with decrease in the joint space.  Degenerative changes are noted throughout the lumbar spine from L1-L5  Xr Pelvis 1-2 Views  Result Date: 06/22/2018 APof the pelvis did not reveal any osteoarthritis of either hip.  The joint spaces are well-maintained.  Bony integrity intact.  Has been mesh for bilateral hernia fixation    PMFS History: Patient Active Problem List   Diagnosis Date Noted  . Rheumatoid arthritis of multiple sites with negative rheumatoid factor ([place]) 03/07/2018  . Rheumatoid nodules ([place]) 03/07/2018  . High risk medication use 03/07/2018  . History of asthma 03/07/2018  . History of hypertension 03/07/2018  . History of hypercholesterolemia 03/07/2018  . History of coronary artery disease 03/07/2018  . CORONARY ARTERY DISEASE 04/27/2010  . NONSPECIFIC ABNORMAL FINDING IN STOOL CONTENTS 04/27/2010  . PERSONAL HISTORY OF COLONIC POLYPS 04/27/2010   Past Medical History:  Diagnosis Date  . Allergy   . Arthritis   . Asthma    oc flare in the past   . Cataract   . Heart murmur   . History of colon polyps   . Hyperlipidemia   . Hypertension   . Myocardial infarction ([place])    2 MI--1980's, 1999    Family History  Problem Relation Age of Onset  . Colon cancer Sister   . Arthritis Mother   . Stroke Father   . Heart attack Father   . Heart Problems Brother   . Heart Problems Brother   . Gout Son   . Colon polyps Neg Hx   . Esophageal cancer Neg Hx   . Rectal cancer Neg Hx   . Stomach cancer Neg Hx     Past Surgical History:  Procedure Laterality Date  . CARDIAC CATHETERIZATION    . CATARACT EXTRACTION Bilateral   . COLONOSCOPY    . CORONARY STENT PLACEMENT    . INGUINAL HERNIA REPAIR Bilateral   . POLYPECTOMY     Social History   Occupational History  . Not on file  Tobacco Use  . Smoking status: Former Research scientist (life sciences)  . Smokeless  tobacco: Never Used  . Tobacco comment: quit 30 years ago  Substance and Sexual Activity  . Alcohol use: No    Alcohol/week: 0.0 standard drinks  . Drug use: Never  . Sexual activity: Not on file

## 2018-06-25 DIAGNOSIS — S0502XD Injury of conjunctiva and corneal abrasion without foreign body, left eye, subsequent encounter: Secondary | ICD-10-CM | POA: Diagnosis not present

## 2018-07-05 DIAGNOSIS — S0502XD Injury of conjunctiva and corneal abrasion without foreign body, left eye, subsequent encounter: Secondary | ICD-10-CM | POA: Diagnosis not present

## 2018-07-19 DIAGNOSIS — L821 Other seborrheic keratosis: Secondary | ICD-10-CM | POA: Diagnosis not present

## 2018-07-23 ENCOUNTER — Ambulatory Visit
Admission: RE | Admit: 2018-07-23 | Discharge: 2018-07-23 | Disposition: A | Discharge status: Home / Self Care | Payer: PPO | Source: Ambulatory Visit | Attending: Orthopaedic Surgery | Admitting: Orthopaedic Surgery

## 2018-07-23 DIAGNOSIS — M48061 Spinal stenosis, lumbar region without neurogenic claudication: Secondary | ICD-10-CM | POA: Diagnosis not present

## 2018-07-23 DIAGNOSIS — R82998 Other abnormal findings in urine: Secondary | ICD-10-CM | POA: Diagnosis not present

## 2018-07-23 DIAGNOSIS — E7849 Other hyperlipidemia: Secondary | ICD-10-CM | POA: Diagnosis not present

## 2018-07-23 DIAGNOSIS — G8929 Other chronic pain: Secondary | ICD-10-CM

## 2018-07-23 DIAGNOSIS — M5441 Lumbago with sciatica, right side: Principal | ICD-10-CM

## 2018-07-23 DIAGNOSIS — E559 Vitamin D deficiency, unspecified: Secondary | ICD-10-CM | POA: Diagnosis not present

## 2018-07-23 DIAGNOSIS — I10 Essential (primary) hypertension: Secondary | ICD-10-CM | POA: Diagnosis not present

## 2018-07-25 NOTE — Progress Notes (Signed)
Office Visit Note  Patient: Carlos Patton             Date of Birth: 1941/08/17           MRN: [id number]             PCP: Velna Hatchet, MD Referring: Velna Hatchet, MD Visit Date: 08/07/2018 Occupation: @GUAROCC @  Subjective:  Lower back pain    History of Present Illness: Carlos Patton is a 77 y.o. male with history of rheumatoid arthritis and rheumatoid nodulosis.  He is taking MTX 4 tablets by mouth once weekly and folic acid 1 mg po daily.  He has not noticed any difference since lowering his methotrexate to 4 tablets instead of 5 tablets once weekly.  He denies any recent rheumatoid arthritis flares.  He denies any joint pain or joint swelling at this time.  He states that he has been experiencing some lower back pain and right-sided sciatica.  She states that he is followed up with Dr. Durward Fortes who placed a referral to physical therapy.  He plans on trying to perform exercises on a regular basis.  He states he has noticed some right lower extremity muscle weakness.  He states in the future he may have an injection.  He denies any other concerns at this time.   Activities of Daily Living:  Patient reports morning stiffness for5  minutes.   Patient Reports nocturnal pain.  Difficulty dressing/grooming: Denies Difficulty climbing stairs: Reports Difficulty getting out of chair: Denies Difficulty using hands for taps, buttons, cutlery, and/or writing: Denies  Review of Systems  Constitutional: Negative for fatigue and night sweats.  HENT: Positive for mouth dryness. Negative for mouth sores, trouble swallowing, trouble swallowing and nose dryness.   Eyes: Negative for redness, visual disturbance and dryness.  Respiratory: Negative for cough, hemoptysis, shortness of breath and difficulty breathing.   Cardiovascular: Negative for chest pain, palpitations, hypertension, irregular heartbeat and swelling in legs/feet.  Gastrointestinal: Negative for blood in stool,  constipation and diarrhea.  Endocrine: Negative for increased urination.  Genitourinary: Negative for painful urination.  Musculoskeletal: Positive for morning stiffness. Negative for arthralgias, joint pain, joint swelling, myalgias, muscle weakness, muscle tenderness and myalgias.  Skin: Negative for color change, rash, hair loss, nodules/bumps, skin tightness, ulcers and sensitivity to sunlight.  Allergic/Immunologic: Negative for susceptible to infections.  Neurological: Negative for dizziness, fainting, memory loss, night sweats and weakness.  Hematological: Negative for swollen glands.  Psychiatric/Behavioral: Negative for depressed mood and sleep disturbance. The patient is not nervous/anxious.     PMFS History:  Patient Active Problem List   Diagnosis Date Noted  . Rheumatoid arthritis of multiple sites with negative rheumatoid factor ([place]) 03/07/2018  . Rheumatoid nodules ([place]) 03/07/2018  . High risk medication use 03/07/2018  . History of asthma 03/07/2018  . History of hypertension 03/07/2018  . History of hypercholesterolemia 03/07/2018  . History of coronary artery disease 03/07/2018  . CORONARY ARTERY DISEASE 04/27/2010  . NONSPECIFIC ABNORMAL FINDING IN STOOL CONTENTS 04/27/2010  . PERSONAL HISTORY OF COLONIC POLYPS 04/27/2010    Past Medical History:  Diagnosis Date  . Allergy   . Arthritis   . Asthma    oc flare in the past   . Cataract   . Heart murmur   . History of colon polyps   . Hyperlipidemia   . Hypertension   . Myocardial infarction ([place])    2 MI--1980's, 1999    Family History  Problem Relation Age of  Onset  . Colon cancer Sister   . Arthritis Mother   . Stroke Father   . Heart attack Father   . Heart Problems Brother   . Heart Problems Brother   . Gout Son   . Colon polyps Neg Hx   . Esophageal cancer Neg Hx   . Rectal cancer Neg Hx   . Stomach cancer Neg Hx    Past Surgical History:  Procedure Laterality Date  . CARDIAC  CATHETERIZATION    . CATARACT EXTRACTION Bilateral   . COLONOSCOPY    . CORONARY STENT PLACEMENT    . INGUINAL HERNIA REPAIR Bilateral   . POLYPECTOMY     Social History   Social History Narrative  . Not on file    Objective: Vital Signs: BP (!) 141/88 (BP Location: Left Arm, Patient Position: Sitting, Cuff Size: Normal)   Pulse (!) 57   Resp 15   Ht 6\' 2"  (1.88 m)   Wt 218 lb (98.9 kg)   BMI 27.99 kg/m    Physical Exam  Constitutional: He is oriented to person, place, and time. He appears well-developed and well-nourished.  HENT:  Head: Normocephalic and atraumatic.  Eyes: Pupils are equal, round, and reactive to light. Conjunctivae and EOM are normal.  Neck: Normal range of motion. Neck supple.  Cardiovascular: Normal rate, regular rhythm and normal heart sounds.  Pulmonary/Chest: Effort normal and breath sounds normal.  Abdominal: Soft. Bowel sounds are normal.  Lymphadenopathy:    He has no cervical adenopathy.  Neurological: He is alert and oriented to person, place, and time.  Skin: Skin is warm and dry. Capillary refill takes less than 2 seconds.  Psychiatric: He has a normal mood and affect. His behavior is normal.  Nursing note and vitals reviewed.    Musculoskeletal Exam: C-spine good ROM.  Thoracic and lumbar spine limited ROM with discomfort.  Shoulder joints, elbow joints, wrist joints, MCPs and PIPs and DIPs good range of motion no synovitis.  He has complete fist formation bilaterally.  PIP and DIP synovial thickening.  Rheumatoid nodulosis noted on the left hand.  Hip joints, knee joints and ankle joints and MTPs and PIPs and DIPs good range of motion no synovitis.  No warmth or effusion bilateral knee joints.  No tenderness of trochanter bursa bilaterally.  CDAI Exam: CDAI Score: 0.8  Patient Global Assessment: 4 (mm); Provider Global Assessment: 4 (mm) Swollen: 0 ; Tender: 0  Joint Exam   Not documented   There is currently no information documented  on the homunculus. Go to the Rheumatology activity and complete the homunculus joint exam.  Investigation: No additional findings.  Imaging: Mr Lumbar Spine W/o Contrast  Result Date: 07/24/2018 CLINICAL DATA:  Chronic low back pain with right hip and leg pain and weakness for 6 months. EXAM: MRI LUMBAR SPINE WITHOUT CONTRAST TECHNIQUE: Multiplanar, multisequence MR imaging of the lumbar spine was performed. No intravenous contrast was administered. COMPARISON:  Lumbar spine radiographs 06/22/2018 FINDINGS: Segmentation: The lowest fully formed intervertebral disc space is designated L5-S1. T12 ribs are small or absent. Alignment: Moderately severe lumbar dextroscoliosis with apex at L2. Bilateral L5 pars defects with 8 mm anterolisthesis of L5 on S1. Vertebrae: No fracture or suspicious osseous lesion. Mild degenerative endplate edema at Q6-5 with mild-to-moderate degenerative edema at L5-S1. Prominent type 2 degenerative endplate changes at H84-O9 and L2-3. Conus medullaris and cauda equina: Conus extends to the L1 level. Conus and cauda equina appear normal. Paraspinal and other soft tissues:  Unremarkable. Disc levels: Disc desiccation and severe disc space narrowing throughout the lumbar spine. Degenerative interbody ankylosis at T12-L1. T12-L1: Mild diffuse osteophytic ridging and mild facet hypertrophy result in mild left lateral recess stenosis without spinal or neural foraminal stenosis. L1-2: Mild osteophytic ridging and mild facet hypertrophy result in borderline left lateral recess stenosis without spinal or neural foraminal stenosis. L2-3: Foraminal endplate osteophytes and mild facet hypertrophy result in mild left neural foraminal stenosis without spinal stenosis. L3-4: Circumferential disc osteophyte complex and mild facet and ligamentum flavum hypertrophy result in mild bilateral neural foraminal stenosis without spinal stenosis. L4-5: Circumferential disc osteophyte complex greater to the  right and mild facet hypertrophy result in moderate right neural foraminal stenosis without spinal stenosis. L5-S1: Anterolisthesis with disc uncovering results in moderate to severe bilateral neural foraminal stenosis without spinal stenosis. IMPRESSION: 1. Lumbar dextroscoliosis with advanced diffuse disc degeneration. 2. L5 pars defects with grade 1 anterolisthesis and moderate to severe bilateral neural foraminal stenosis. 3. Moderate right neural foraminal stenosis at L4-5. 4. No significant spinal stenosis. Electronically Signed   By: Logan Bores M.D.   On: 07/24/2018 00:13    Recent Labs: Lab Results  Component Value Date   WBC 4.4 06/07/2018   HGB 13.6 06/07/2018   PLT 129 (L) 06/07/2018   NA 141 06/07/2018   K 3.8 06/07/2018   CL 105 06/07/2018   CO2 27 06/07/2018   GLUCOSE 146 (H) 06/07/2018   BUN 12 06/07/2018   CREATININE 0.92 06/07/2018   BILITOT 0.9 06/07/2018   ALKPHOS 130 (H) 05/29/2017   AST 26 06/07/2018   ALT 13 06/07/2018   PROT 6.1 06/07/2018   ALBUMIN 3.6 05/29/2017   CALCIUM 9.2 06/07/2018   GFRAA 93 06/07/2018    Speciality Comments: No specialty comments available.  Procedures:  No procedures performed Allergies: Patient has no known allergies.    Assessment / Plan:     Visit Diagnoses: Rheumatoid arthritis of multiple sites with negative rheumatoid factor (HCC) - +CCP with nodulosis: He has no active synovitis.  He has not had any recent rheumatoid arthritis flares.  He has no joint pain or joint swelling at this time.  He has chronic rheumatoid nodulosis of the left hand.  His rheumatoid arthritis is well controlled on methotrexate 4 tablets by mouth once weekly.  He decreased his dose of methotrexate from 5 tablets once weekly to 4 tablets once weekly about 6 months ago and has not noticed any difference.  He denies any joint pain or joint swelling at this time.  He has morning stiffness lasting about 5 minutes every morning.  He has had his yearly  influenza vaccine as well as the pneumonia vaccine.  He will continue on methotrexate 4 tablets by mouth once weekly and folic acid 1 mg by mouth daily.  A refill of methotrexate will be sent to the pharmacy.  He was advised to notify us if he develops increased joint pain or joint swelling.  He will follow-up in the office in 5 months.  High risk medication use - MTX 4 tablets by mouth every week, folic acid 1 mg by mouth daily.  CBC and CMP are drawn on 05/28/2018.  Platelets were low at that time.  He will return for lab work in December and every 3 months to monitor for drug toxicity.  Standing orders are in place.  He has had the yearly influenza vaccine as well as his pneumonia vaccine.  Rheumatoid nodules ([place]): Chronic rheumatoid nodulosis  present on the left hand.  Chronic midline low back pain with right-sided sciatica: He has no midline spinal tenderness on exam today.  He has been experiencing right-sided sciatica and right lower extremity muscle weakness.  He has been evaluated by Dr. Durward Fortes and a referral has been placed to physical therapy. MRI of lumbar spine on 07/23/18 revealed lumbar dextroscoliosis with advanced diffuse disc degeneration, L5 pars defect with grade 1 anterolisthesis and moderate to severe bilateral neural foraminal stenosis.  Moderate right neural foraminal stenosis at L4-L5.  No significant spinal stenosis noted.    Other medical conditions are listed as follows:  History of hypercholesterolemia  History of coronary artery disease - Status post a stent placement   History of hypertension  History of asthma   Orders: No orders of the defined types were placed in this encounter.  No orders of the defined types were placed in this encounter.     Follow-Up Instructions: Return in about 5 months (around 01/06/2019) for Rheumatoid arthritis.   Ofilia Neas, PA-C   I examined and evaluated the patient with Carlos Sams PA.  Patient had no synovitis on my  examination today.  He continues to have a lot of rheumatoid nodules.  He will continue on current treatment.  The plan of care was discussed as noted above.  Bo Merino, MD  Note - This record has been created using Editor, commissioning.  Chart creation errors have been sought, but may not always  have been located. Such creation errors do not reflect on  the standard of medical care.

## 2018-07-27 ENCOUNTER — Other Ambulatory Visit (INDEPENDENT_AMBULATORY_CARE_PROVIDER_SITE_OTHER): Payer: Self-pay | Admitting: Radiology

## 2018-07-27 ENCOUNTER — Ambulatory Visit (INDEPENDENT_AMBULATORY_CARE_PROVIDER_SITE_OTHER): Payer: PPO | Admitting: Orthopaedic Surgery

## 2018-07-27 DIAGNOSIS — G8929 Other chronic pain: Secondary | ICD-10-CM

## 2018-07-27 DIAGNOSIS — M545 Low back pain, unspecified: Secondary | ICD-10-CM

## 2018-07-27 DIAGNOSIS — M5441 Lumbago with sciatica, right side: Secondary | ICD-10-CM

## 2018-07-27 NOTE — Progress Notes (Signed)
Office Visit Note   Patient: Carlos Patton           Date of Birth: 03-18-1941           MRN: [id number] Visit Date: 07/27/2018              Requested by: Velna Hatchet, MD [address] [place], [place] [address] PCP: Velna Hatchet, MD   Assessment & Plan: Visit Diagnoses:  1. Chronic right-sided low back pain with right-sided sciatica     Plan: MRI scan demonstrates lumbar dextroscoliosis with advanced diffuse disc degeneration.  At L5 there is a pars defect with a grade 1 anterior listhesis and moderate to severe bilateral neural foraminal stenosis.  Moderate right neural foraminal stenosis at L4-5.  No significant spinal stenosis.  Long discussion with Mr. and [name] regarding the findings.  Would suggest a course of physical therapy at [facility] in [place].  Consider epidural steroid injection.  He will let me know.  Follow-Up Instructions: Return if symptoms worsen or fail to improve.   Orders:  No orders of the defined types were placed in this encounter.  No orders of the defined types were placed in this encounter.     Procedures: No procedures performed   Clinical Data: No additional findings.   Subjective: Chief Complaint  Patient presents with  . Lower Back - Pain  . Back Pain    MRI review lumbar  Symptoms have not changed.  Predominately right lower extremity radicular pain.  Minimal back discomfort.  Occasional numbness and tingling into right and left foot.  HPI  Review of Systems   Objective: Vital Signs: There were no vitals taken for this visit.  Physical Exam  Constitutional: He is oriented to person, place, and time. He appears well-developed and well-nourished.  HENT:  Mouth/Throat: Oropharynx is clear and moist.  Eyes: Pupils are equal, round, and reactive to light. EOM are normal.  Pulmonary/Chest: Effort normal.  Neurological: He is alert and oriented to person, place, and time.  Skin: Skin is warm and dry.    Psychiatric: He has a normal mood and affect. His behavior is normal.    Ortho Exam awake alert and oriented x3.  Comfortable sitting.  Straight leg raise negative.  Motor exam intact.  Good sensibility both feet.  No percussible tenderness lumbar spine.  Painless range of motion of both hips and knees.  Problem appears to be nerve root irritation referable to his lumbar spine  Specialty Comments:  No specialty comments available.  Imaging: No results found.   PMFS History: Patient Active Problem List   Diagnosis Date Noted  . Rheumatoid arthritis of multiple sites with negative rheumatoid factor ([place]) 03/07/2018  . Rheumatoid nodules ([place]) 03/07/2018  . High risk medication use 03/07/2018  . History of asthma 03/07/2018  . History of hypertension 03/07/2018  . History of hypercholesterolemia 03/07/2018  . History of coronary artery disease 03/07/2018  . CORONARY ARTERY DISEASE 04/27/2010  . NONSPECIFIC ABNORMAL FINDING IN STOOL CONTENTS 04/27/2010  . PERSONAL HISTORY OF COLONIC POLYPS 04/27/2010   Past Medical History:  Diagnosis Date  . Allergy   . Arthritis   . Asthma    oc flare in the past   . Cataract   . Heart murmur   . History of colon polyps   . Hyperlipidemia   . Hypertension   . Myocardial infarction ([place])    2 MI--1980's, 1999    Family History  Problem Relation Age of  Onset  . Colon cancer Sister   . Arthritis Mother   . Stroke Father   . Heart attack Father   . Heart Problems Brother   . Heart Problems Brother   . Gout Son   . Colon polyps Neg Hx   . Esophageal cancer Neg Hx   . Rectal cancer Neg Hx   . Stomach cancer Neg Hx     Past Surgical History:  Procedure Laterality Date  . CARDIAC CATHETERIZATION    . CATARACT EXTRACTION Bilateral   . COLONOSCOPY    . CORONARY STENT PLACEMENT    . INGUINAL HERNIA REPAIR Bilateral   . POLYPECTOMY     Social History   Occupational History  . Not on file  Tobacco Use  . Smoking status: Former  Research scientist (life sciences)  . Smokeless tobacco: Never Used  . Tobacco comment: quit 30 years ago  Substance and Sexual Activity  . Alcohol use: No    Alcohol/week: 0.0 standard drinks  . Drug use: Never  . Sexual activity: Not on file     Garald Balding, MD   Note - This record has been created using [employer].  Chart creation errors have been sought, but may not always  have been located. Such creation errors do not reflect on  the standard of medical care.

## 2018-07-31 DIAGNOSIS — I10 Essential (primary) hypertension: Secondary | ICD-10-CM | POA: Diagnosis not present

## 2018-07-31 DIAGNOSIS — R972 Elevated prostate specific antigen [PSA]: Secondary | ICD-10-CM | POA: Diagnosis not present

## 2018-07-31 DIAGNOSIS — M5416 Radiculopathy, lumbar region: Secondary | ICD-10-CM | POA: Diagnosis not present

## 2018-07-31 DIAGNOSIS — I25118 Atherosclerotic heart disease of native coronary artery with other forms of angina pectoris: Secondary | ICD-10-CM | POA: Diagnosis not present

## 2018-07-31 DIAGNOSIS — Z23 Encounter for immunization: Secondary | ICD-10-CM | POA: Diagnosis not present

## 2018-07-31 DIAGNOSIS — Z6829 Body mass index (BMI) 29.0-29.9, adult: Secondary | ICD-10-CM | POA: Diagnosis not present

## 2018-07-31 DIAGNOSIS — I129 Hypertensive chronic kidney disease with stage 1 through stage 4 chronic kidney disease, or unspecified chronic kidney disease: Secondary | ICD-10-CM | POA: Diagnosis not present

## 2018-07-31 DIAGNOSIS — E7849 Other hyperlipidemia: Secondary | ICD-10-CM | POA: Diagnosis not present

## 2018-07-31 DIAGNOSIS — Z1389 Encounter for screening for other disorder: Secondary | ICD-10-CM | POA: Diagnosis not present

## 2018-07-31 DIAGNOSIS — M859 Disorder of bone density and structure, unspecified: Secondary | ICD-10-CM | POA: Diagnosis not present

## 2018-07-31 DIAGNOSIS — Z79899 Other long term (current) drug therapy: Secondary | ICD-10-CM | POA: Diagnosis not present

## 2018-07-31 DIAGNOSIS — Z0000 Encounter for general adult medical examination without abnormal findings: Secondary | ICD-10-CM | POA: Diagnosis not present

## 2018-07-31 DIAGNOSIS — E559 Vitamin D deficiency, unspecified: Secondary | ICD-10-CM | POA: Diagnosis not present

## 2018-08-03 DIAGNOSIS — Z1212 Encounter for screening for malignant neoplasm of rectum: Secondary | ICD-10-CM | POA: Diagnosis not present

## 2018-08-07 ENCOUNTER — Encounter: Payer: Self-pay | Admitting: Rheumatology

## 2018-08-07 ENCOUNTER — Ambulatory Visit: Payer: PPO | Admitting: Rheumatology

## 2018-08-07 VITALS — BP 141/88 | HR 57 | Resp 15 | Ht 74.0 in | Wt 218.0 lb

## 2018-08-07 DIAGNOSIS — Z8679 Personal history of other diseases of the circulatory system: Secondary | ICD-10-CM

## 2018-08-07 DIAGNOSIS — M0630 Rheumatoid nodule, unspecified site: Secondary | ICD-10-CM

## 2018-08-07 DIAGNOSIS — Z8639 Personal history of other endocrine, nutritional and metabolic disease: Secondary | ICD-10-CM | POA: Diagnosis not present

## 2018-08-07 DIAGNOSIS — G8929 Other chronic pain: Secondary | ICD-10-CM | POA: Diagnosis not present

## 2018-08-07 DIAGNOSIS — M5441 Lumbago with sciatica, right side: Secondary | ICD-10-CM | POA: Diagnosis not present

## 2018-08-07 DIAGNOSIS — Z8709 Personal history of other diseases of the respiratory system: Secondary | ICD-10-CM | POA: Diagnosis not present

## 2018-08-07 DIAGNOSIS — Z79899 Other long term (current) drug therapy: Secondary | ICD-10-CM

## 2018-08-07 DIAGNOSIS — M0609 Rheumatoid arthritis without rheumatoid factor, multiple sites: Secondary | ICD-10-CM

## 2018-08-07 MED ORDER — METHOTREXATE 2.5 MG PO TABS: ORAL_TABLET | ORAL | 0 refills | Status: DC

## 2018-08-07 NOTE — Patient Instructions (Signed)
Standing Labs We placed an order today for your standing lab work.    Please come back and get your standing labs in December and every 3 months   We have open lab Monday through Friday from 8:30-11:30 AM and 1:30-4:00 PM  at the office of Dr. Shaili Deveshwar.   You may experience shorter wait times on Monday and Friday afternoons. The office is located at [address], [address], [place], [place] [address] No appointment is necessary.   Labs are drawn by [facility].  You may receive a bill from [facility] for your lab work. If you have any questions regarding directions or hours of operation,  please call [phone].   Just as a reminder please drink plenty of water prior to coming for your lab work. Thanks!   

## 2018-08-08 ENCOUNTER — Telehealth: Payer: Self-pay | Admitting: *Deleted

## 2018-08-08 NOTE — Telephone Encounter (Signed)
Carlos Patton from [facility] contacted the office stating they received a prescription for MTX that was sent was in the office. Prescription states they patient was to take 4 tabs 12.5 mg of MTX weekly. Patient has been decreased to 4 tabs weekly which is 10 mg. [name] advised and noted patient's chart.

## 2018-08-09 DIAGNOSIS — M4726 Other spondylosis with radiculopathy, lumbar region: Secondary | ICD-10-CM | POA: Diagnosis not present

## 2018-08-09 DIAGNOSIS — M6281 Muscle weakness (generalized): Secondary | ICD-10-CM | POA: Diagnosis not present

## 2018-08-13 DIAGNOSIS — N138 Other obstructive and reflux uropathy: Secondary | ICD-10-CM | POA: Diagnosis not present

## 2018-08-13 DIAGNOSIS — N401 Enlarged prostate with lower urinary tract symptoms: Secondary | ICD-10-CM | POA: Diagnosis not present

## 2018-08-13 DIAGNOSIS — R972 Elevated prostate specific antigen [PSA]: Secondary | ICD-10-CM | POA: Diagnosis not present

## 2018-09-12 ENCOUNTER — Other Ambulatory Visit: Payer: Self-pay

## 2018-09-12 ENCOUNTER — Emergency Department (HOSPITAL_COMMUNITY): Payer: PPO

## 2018-09-12 ENCOUNTER — Inpatient Hospital Stay (HOSPITAL_COMMUNITY)
Admission: EM | Admit: 2018-09-12 | Discharge: 2018-09-14 | DRG: 871 | Disposition: A | Discharge status: Home / Self Care | Payer: PPO | Attending: Family Medicine | Admitting: Family Medicine

## 2018-09-12 ENCOUNTER — Encounter (HOSPITAL_COMMUNITY): Payer: Self-pay | Admitting: Emergency Medicine

## 2018-09-12 DIAGNOSIS — R011 Cardiac murmur, unspecified: Secondary | ICD-10-CM | POA: Diagnosis not present

## 2018-09-12 DIAGNOSIS — R0602 Shortness of breath: Secondary | ICD-10-CM | POA: Diagnosis not present

## 2018-09-12 DIAGNOSIS — I252 Old myocardial infarction: Secondary | ICD-10-CM

## 2018-09-12 DIAGNOSIS — Z800 Family history of malignant neoplasm of digestive organs: Secondary | ICD-10-CM | POA: Diagnosis not present

## 2018-09-12 DIAGNOSIS — R05 Cough: Secondary | ICD-10-CM | POA: Diagnosis not present

## 2018-09-12 DIAGNOSIS — I10 Essential (primary) hypertension: Secondary | ICD-10-CM | POA: Insufficient documentation

## 2018-09-12 DIAGNOSIS — M069 Rheumatoid arthritis, unspecified: Secondary | ICD-10-CM | POA: Diagnosis present

## 2018-09-12 DIAGNOSIS — J45909 Unspecified asthma, uncomplicated: Secondary | ICD-10-CM | POA: Diagnosis not present

## 2018-09-12 DIAGNOSIS — Z8261 Family history of arthritis: Secondary | ICD-10-CM | POA: Diagnosis not present

## 2018-09-12 DIAGNOSIS — Z87891 Personal history of nicotine dependence: Secondary | ICD-10-CM

## 2018-09-12 DIAGNOSIS — R509 Fever, unspecified: Secondary | ICD-10-CM | POA: Diagnosis not present

## 2018-09-12 DIAGNOSIS — Z79899 Other long term (current) drug therapy: Secondary | ICD-10-CM

## 2018-09-12 DIAGNOSIS — I2510 Atherosclerotic heart disease of native coronary artery without angina pectoris: Secondary | ICD-10-CM | POA: Diagnosis not present

## 2018-09-12 DIAGNOSIS — Z8249 Family history of ischemic heart disease and other diseases of the circulatory system: Secondary | ICD-10-CM

## 2018-09-12 DIAGNOSIS — A419 Sepsis, unspecified organism: Principal | ICD-10-CM | POA: Diagnosis present

## 2018-09-12 DIAGNOSIS — Z7982 Long term (current) use of aspirin: Secondary | ICD-10-CM | POA: Diagnosis not present

## 2018-09-12 DIAGNOSIS — I700 Atherosclerosis of aorta: Secondary | ICD-10-CM | POA: Diagnosis not present

## 2018-09-12 DIAGNOSIS — M549 Dorsalgia, unspecified: Secondary | ICD-10-CM | POA: Diagnosis not present

## 2018-09-12 DIAGNOSIS — Z955 Presence of coronary angioplasty implant and graft: Secondary | ICD-10-CM | POA: Diagnosis not present

## 2018-09-12 DIAGNOSIS — M0609 Rheumatoid arthritis without rheumatoid factor, multiple sites: Secondary | ICD-10-CM | POA: Diagnosis present

## 2018-09-12 DIAGNOSIS — Z7951 Long term (current) use of inhaled steroids: Secondary | ICD-10-CM

## 2018-09-12 DIAGNOSIS — Z823 Family history of stroke: Secondary | ICD-10-CM | POA: Diagnosis not present

## 2018-09-12 DIAGNOSIS — E785 Hyperlipidemia, unspecified: Secondary | ICD-10-CM | POA: Diagnosis present

## 2018-09-12 DIAGNOSIS — R000 Tachycardia, unspecified: Secondary | ICD-10-CM | POA: Diagnosis not present

## 2018-09-12 DIAGNOSIS — A403 Sepsis due to Streptococcus pneumoniae: Secondary | ICD-10-CM | POA: Diagnosis not present

## 2018-09-12 DIAGNOSIS — J189 Pneumonia, unspecified organism: Secondary | ICD-10-CM | POA: Diagnosis not present

## 2018-09-12 DIAGNOSIS — Z8679 Personal history of other diseases of the circulatory system: Secondary | ICD-10-CM

## 2018-09-12 DIAGNOSIS — Z8709 Personal history of other diseases of the respiratory system: Secondary | ICD-10-CM

## 2018-09-12 HISTORY — DX: Pneumonia, unspecified organism: J18.9

## 2018-09-12 HISTORY — DX: Sepsis, unspecified organism: A41.9

## 2018-09-12 LAB — COMPREHENSIVE METABOLIC PANEL
ALT: 17 U/L (ref 0–44)
AST: 37 U/L (ref 15–41)
Albumin: 4.3 g/dL (ref 3.5–5.0)
Alkaline Phosphatase: 81 U/L (ref 38–126)
Anion gap: 12 (ref 5–15)
BUN: 12 mg/dL (ref 8–23)
CO2: 24 mmol/L (ref 22–32)
Calcium: 10.4 mg/dL — ABNORMAL HIGH (ref 8.9–10.3)
Chloride: 104 mmol/L (ref 98–111)
Creatinine, Ser: 1.13 mg/dL (ref 0.61–1.24)
GFR calc non Af Amer: >60 mL/min (ref >60)
Glucose, Bld: 107 mg/dL — ABNORMAL HIGH (ref 70–99)
Potassium: 4.1 mmol/L (ref 3.5–5.1)
SODIUM: 140 mmol/L (ref 135–145)
Total Bilirubin: 1.5 mg/dL — ABNORMAL HIGH (ref 0.3–1.2)
Total Protein: 7.6 g/dL (ref 6.5–8.1)

## 2018-09-12 LAB — CBC WITH DIFFERENTIAL/PLATELET
Abs Immature Granulocytes: 0.05 10*3/uL (ref 0.00–0.07)
BASOS ABS: 0 10*3/uL (ref 0.0–0.1)
Basophils Relative: 0 %
Eosinophils Absolute: 0.1 10*3/uL (ref 0.0–0.5)
Eosinophils Relative: 1 %
HCT: 45.9 % (ref 39.0–52.0)
Hemoglobin: 15.1 g/dL (ref 13.0–17.0)
Immature Granulocytes: 0 %
Lymphocytes Relative: 4 %
Lymphs Abs: 0.5 10*3/uL — ABNORMAL LOW (ref 0.7–4.0)
MCH: 33 pg (ref 26.0–34.0)
MCHC: 32.9 g/dL (ref 30.0–36.0)
MCV: 100.2 fL — ABNORMAL HIGH (ref 80.0–100.0)
Monocytes Absolute: 0.4 10*3/uL (ref 0.1–1.0)
Monocytes Relative: 3 %
NRBC: 0 % (ref 0.0–0.2)
Neutro Abs: 12.1 10*3/uL — ABNORMAL HIGH (ref 1.7–7.7)
Neutrophils Relative %: 92 %
Platelets: 141 10*3/uL — ABNORMAL LOW (ref 150–400)
RBC: 4.58 MIL/uL (ref 4.22–5.81)
RDW: 13.5 % (ref 11.5–15.5)
WBC: 13 10*3/uL — ABNORMAL HIGH (ref 4.0–10.5)

## 2018-09-12 LAB — INFLUENZA PANEL BY PCR (TYPE A & B)
Influenza A By PCR: NEGATIVE
Influenza B By PCR: NEGATIVE

## 2018-09-12 LAB — URINALYSIS, ROUTINE W REFLEX MICROSCOPIC
Bilirubin Urine: NEGATIVE
Glucose, UA: NEGATIVE mg/dL
Hgb urine dipstick: NEGATIVE
KETONES UR: 5 mg/dL — AB
Leukocytes, UA: NEGATIVE
Nitrite: NEGATIVE
Protein, ur: NEGATIVE mg/dL
Specific Gravity, Urine: 1.009 (ref 1.005–1.030)
pH: 8 (ref 5.0–8.0)

## 2018-09-12 LAB — LACTIC ACID, PLASMA: Lactic Acid, Venous: 1.1 mmol/L (ref 0.5–1.9)

## 2018-09-12 LAB — I-STAT CG4 LACTIC ACID, ED: Lactic Acid, Venous: 2.46 mmol/L (ref 0.5–1.9)

## 2018-09-12 MED ORDER — ONDANSETRON HCL 4 MG PO TABS: 4.0000 mg | ORAL_TABLET | Freq: Four times a day (QID) | ORAL | Status: DC | PRN

## 2018-09-12 MED ORDER — ACETAMINOPHEN 650 MG RE SUPP: 650.0000 mg | Freq: Four times a day (QID) | RECTAL | Status: DC | PRN

## 2018-09-12 MED ORDER — NIACIN ER 500 MG PO CPCR
500.0000 mg | ORAL_CAPSULE | Freq: Every day | ORAL | Status: DC
  Administered 2018-09-13 – 2018-09-14 (×2): 500 mg via ORAL
  Filled 2018-09-12 (×2): qty 1

## 2018-09-12 MED ORDER — ASPIRIN 325 MG PO TABS
325.0000 mg | ORAL_TABLET | Freq: Every day | ORAL | Status: DC
  Administered 2018-09-12 – 2018-09-13 (×2): 325 mg via ORAL
  Filled 2018-09-12 (×2): qty 1

## 2018-09-12 MED ORDER — NIACIN ER (ANTIHYPERLIPIDEMIC) 500 MG PO TBCR: 750.0000 mg | EXTENDED_RELEASE_TABLET | Freq: Two times a day (BID) | ORAL | Status: DC

## 2018-09-12 MED ORDER — BISACODYL 10 MG RE SUPP: 10.0000 mg | Freq: Every day | RECTAL | Status: DC | PRN

## 2018-09-12 MED ORDER — TRAZODONE HCL 50 MG PO TABS: 25.0000 mg | ORAL_TABLET | Freq: Every evening | ORAL | Status: DC | PRN

## 2018-09-12 MED ORDER — SODIUM CHLORIDE 0.9 % IV SOLN
INTRAVENOUS | Status: AC
  Administered 2018-09-12: 19:00:00 via INTRAVENOUS

## 2018-09-12 MED ORDER — ONDANSETRON HCL 4 MG/2ML IJ SOLN: 4.0000 mg | Freq: Four times a day (QID) | INTRAMUSCULAR | Status: DC | PRN

## 2018-09-12 MED ORDER — ACETAMINOPHEN 325 MG PO TABS: 650.0000 mg | ORAL_TABLET | Freq: Four times a day (QID) | ORAL | Status: DC | PRN

## 2018-09-12 MED ORDER — VITAMIN D 25 MCG (1000 UNIT) PO TABS
1000.0000 [IU] | ORAL_TABLET | Freq: Every day | ORAL | Status: DC
  Administered 2018-09-13 – 2018-09-14 (×2): 1000 [IU] via ORAL
  Filled 2018-09-12 (×4): qty 1

## 2018-09-12 MED ORDER — NIACIN ER 500 MG PO CPCR
1000.0000 mg | ORAL_CAPSULE | Freq: Every day | ORAL | Status: DC
  Administered 2018-09-12: 1000 mg via ORAL
  Administered 2018-09-13: 500 mg via ORAL
  Filled 2018-09-12 (×2): qty 2

## 2018-09-12 MED ORDER — FOLIC ACID 1 MG PO TABS
2.0000 mg | ORAL_TABLET | Freq: Every day | ORAL | Status: DC
  Administered 2018-09-13 – 2018-09-14 (×2): 2 mg via ORAL
  Filled 2018-09-12 (×2): qty 2

## 2018-09-12 MED ORDER — SODIUM CHLORIDE 0.9 % IV BOLUS
1000.0000 mL | Freq: Once | INTRAVENOUS | Status: AC
  Administered 2018-09-12: 1000 mL via INTRAVENOUS

## 2018-09-12 MED ORDER — SODIUM CHLORIDE 0.9 % IV SOLN: 500.0000 mg | INTRAVENOUS | Status: DC

## 2018-09-12 MED ORDER — GUAIFENESIN ER 600 MG PO TB12
600.0000 mg | ORAL_TABLET | Freq: Two times a day (BID) | ORAL | Status: DC
  Administered 2018-09-12 – 2018-09-14 (×4): 600 mg via ORAL
  Filled 2018-09-12 (×4): qty 1

## 2018-09-12 MED ORDER — FLUTICASONE PROPIONATE 50 MCG/ACT NA SUSP
1.0000 | NASAL | Status: DC | PRN
  Filled 2018-09-12: qty 16

## 2018-09-12 MED ORDER — SODIUM CHLORIDE 0.9 % IV SOLN
500.0000 mg | INTRAVENOUS | Status: DC
  Administered 2018-09-12 – 2018-09-13 (×2): 500 mg via INTRAVENOUS
  Filled 2018-09-12 (×3): qty 500

## 2018-09-12 MED ORDER — SODIUM CHLORIDE 0.9 % IV SOLN: 1.0000 g | INTRAVENOUS | Status: DC

## 2018-09-12 MED ORDER — SENNOSIDES-DOCUSATE SODIUM 8.6-50 MG PO TABS: 1.0000 | ORAL_TABLET | Freq: Every evening | ORAL | Status: DC | PRN

## 2018-09-12 MED ORDER — ACETAMINOPHEN 325 MG PO TABS: 325.0000 mg | ORAL_TABLET | Freq: Four times a day (QID) | ORAL | Status: DC | PRN

## 2018-09-12 MED ORDER — SODIUM CHLORIDE 0.9 % IV SOLN
2.0000 g | INTRAVENOUS | Status: DC
  Administered 2018-09-12 – 2018-09-13 (×2): 2 g via INTRAVENOUS
  Filled 2018-09-12 (×3): qty 20

## 2018-09-12 MED ORDER — ALBUTEROL SULFATE (2.5 MG/3ML) 0.083% IN NEBU
2.5000 mg | INHALATION_SOLUTION | Freq: Four times a day (QID) | RESPIRATORY_TRACT | Status: AC
  Administered 2018-09-12 (×2): 2.5 mg via RESPIRATORY_TRACT
  Filled 2018-09-12 (×2): qty 3

## 2018-09-12 MED ORDER — OSELTAMIVIR PHOSPHATE 75 MG PO CAPS
75.0000 mg | ORAL_CAPSULE | Freq: Two times a day (BID) | ORAL | Status: DC
  Administered 2018-09-12 – 2018-09-13 (×2): 75 mg via ORAL
  Filled 2018-09-12 (×2): qty 1

## 2018-09-12 NOTE — ED Notes (Signed)
Admitting MD at bedside.

## 2018-09-12 NOTE — ED Provider Notes (Signed)
Medical screening examination/treatment/procedure(s) were conducted as a shared visit with non-physician practitioner(s) and myself.  I personally evaluated the patient during the encounter. Briefly, the patient is a 77 y.o. male is a 77 year old male with history of asthma, high cholesterol, hypertension, rheumatoid arthritis on methotrexate who presents to the ED with fever, chills, cough.  Patient with symptoms for the last several days.  Has some diminished breath sounds on exam.  Does not have any abdominal pain, no urinary symptoms.  Patient with fever, tachycardia upon arrival.  Concern for sepsis.  Sepsis work-up initiated.  Chest x-ray shows signs of possible pneumonia.  Patient empirically treated with IV Zithromax and IV Rocephin.  Patient had mildly elevated lactic acid and was given lactated Ringer bolus.  Patient otherwise with mild leukocytosis.  Patient with no signs of urinary tract infection.  Otherwise no significant electrolyte abnormality, kidney injury.  Flu was negative.  Patient to be admitted for further sepsis care.  Remained hemodynamically stable throughout my care.  Tachycardia improved following fluids.  This chart was dictated using voice recognition software.  Despite best efforts to proofread,  errors can occur which can change the documentation meaning.    EKG Interpretation  Date/Time:  Wednesday September 12 2018 13:53:43 EST Ventricular Rate:  124 PR Interval:    QRS Duration: 107 QT Interval:  312 QTC Calculation: 449 R Axis:   85 Text Interpretation:  Sinus tachycardia Ventricular premature complex Inferior infarct, age indeterminate Confirmed by Lennice Sites [id number]) on 09/12/2018 1:57:35 PM            Lennice Sites, DO 09/12/18 1520

## 2018-09-12 NOTE — ED Notes (Signed)
Patient transported to X-ray 

## 2018-09-12 NOTE — Progress Notes (Signed)
Carlos Patton is a 77 y.o. male patient admitted from ED awake, alert - oriented  X 4 - no acute distress noted.  VSS - Blood pressure (!) 106/52, pulse (!) 107, temperature 98.7 F (37.1 C), temperature source Oral, resp. rate 18, height 6\' 2"  (1.88 m), weight 94.6 kg, SpO2 97 %.    IV in place, occlusive dsg intact without redness.  Orientation to room, and floor completed.  Admission INP armband ID verified with patient/family, and in place.   Fall assessment complete, with patient able to verbalize understanding of risk associated with falls, and verbalized understanding to call nsg before up out of bed.  Call light within reach, patient able to voice, and demonstrate understanding.  Skin, clean-dry- intact without evidence of bruising, or skin tears.   No evidence of skin break down noted on exam.     Will cont to eval and treat per MD orders.  Patience Musca, RN 09/12/2018 7:25 PM

## 2018-09-12 NOTE — H&P (Addendum)
History and Physical    Carlos Patton:[id number] DOB: 19-Feb-1941 DOA: 09/12/2018  Referring MD/NP/PA: Marijean Bravo PCP: Velna Hatchet, MD  Outpatient Specialists: Patient coming from: home  Chief Complaint: fever chills  HPI: Carlos Patton is a 77 y.o. male with medical history significant for pretension, hyperlipidemia, MI, rheumatoid arthritis on methotrexate presents emergency department chief complaint fever chills intermittent nonproductive cough general malaise.  Initial evaluation revealed sepsis likely related to community-acquired pneumonia.  [facility] are asked to admit  Information is obtained from the patient and his wife is at the bedside.  Patient states he started with a sore throat intermittently about a week ago.  That progressed to a moist sounding nonproductive cough.  Yesterday he felt like he had a fever "off and on".  He describes chills and general aches "all over".  This morning felt very unwell and took Tylenol.  He denies chest pain palpitations.  He does endorse some shortness of breath with exertion but denies orthopnea or lower extremity edema.  He denies abdominal pain nausea vomiting but does endorse decreased appetite.  He denies dysuria hematuria frequency urgency.  He denies diarrhea constipation melena bright red blood per rectum.  Denies headache dizziness syncope or near syncope.  ED Course: In the emergency department he has a temperature of 102.8 orally a leukocytosis elevated lactic acid tachycardia tachypnea.  He is not hypoxic provided with IV fluids IV antibiotics and Tylenol.  Chest x-rayRight infrahilar airspace opacity or mass. Given the patient's productive cough, fever, and chills, pneumonia is the favored diagnosis, but lung cancer is not excluded. Followup PA and lateral chest X-ray is recommended in 3-4 weeks following trial of antibiotic therapy to ensure resolution and exclude underlying malignancy. Alternatively, chest CT  (preferably with contrast) could be utilized to further assess. Trace right pleural effusion.  Aortic Atherosclerosis   Review of Systems: As per HPI otherwise 10 point review of systems negative.  10 point review of systems complete and all systems are negative except as indicated in the HPI Past Medical History:  Diagnosis Date  . Allergy   . Arthritis   . Asthma    oc flare in the past   . CAP (community acquired pneumonia)   . Cataract   . Heart murmur   . History of colon polyps   . Hyperlipidemia   . Hypertension   . Myocardial infarction ([place])    2 MI--1980's, 1999  . Sepsis [facility])     Past Surgical History:  Procedure Laterality Date  . CARDIAC CATHETERIZATION    . CATARACT EXTRACTION Bilateral   . COLONOSCOPY    . CORONARY STENT PLACEMENT    . INGUINAL HERNIA REPAIR Bilateral   . POLYPECTOMY       reports that he has quit smoking. He has never used smokeless tobacco. He reports that he does not drink alcohol or use drugs.  No Known Allergies  Family History  Problem Relation Age of Onset  . Colon cancer Sister   . Arthritis Mother   . Stroke Father   . Heart attack Father   . Heart Problems Brother   . Heart Problems Brother   . Gout Son   . Colon polyps Neg Hx   . Esophageal cancer Neg Hx   . Rectal cancer Neg Hx   . Stomach cancer Neg Hx     Prior to Admission medications   Medication Sig Start Date End Date Taking? Authorizing Provider  acetaminophen (TYLENOL) 325 MG tablet  Take by mouth every 6 (six) hours as needed for mild pain or moderate pain.   Yes [provider]  albuterol (PROVENTIL HFA;VENTOLIN HFA) 108 (90 Base) MCG/ACT inhaler Inhale 1 puff into the lungs every 6 (six) hours as needed for wheezing or shortness of breath. Reported on 10/06/2015   Yes [provider]  albuterol (PROVENTIL) (2.5 MG/3ML) 0.083% nebulizer solution Take 2.5 mg by nebulization every 6 (six) hours as needed for wheezing or shortness of breath.    Yes [provider]  aspirin 325 MG tablet Take 325 mg by mouth at bedtime.    Yes [provider]  atenolol (TENORMIN) 25 MG tablet Take 25 mg by mouth daily.  06/17/10  Yes [provider]  budesonide-formoterol (SYMBICORT) 160-4.5 MCG/ACT inhaler Inhale 2 puffs into the lungs as needed (Shortness of breath). Reported on 10/06/2015 02/08/13  Yes [provider]  Cholecalciferol (VITAMIN D3 PO) Take 2 tablets by mouth every morning.   Yes [provider]  fluticasone (FLONASE) 50 MCG/ACT nasal spray Place 1 spray into both nostrils as needed for allergies or rhinitis. Reported on 10/06/2015 08/06/13  Yes [provider]  folic acid (FOLVITE) 1 MG tablet Take 2 mg by mouth daily.    Yes [provider]  lisinopril (PRINIVIL,ZESTRIL) 20 MG tablet Take 20 mg by mouth at bedtime.  08/01/18  Yes [provider]  MAGNESIUM OXIDE PO Take 1 tablet by mouth at bedtime.   Yes [provider]  methotrexate (RHEUMATREX) 2.5 MG tablet Take 4 tablets (12.5mg  total) by mouth once a week (Caution: chemotherapy. Protect from light) 08/07/18  Yes Ofilia Neas, PA-C  Multiple Vitamin (ONE-A-DAY MENS PO) Take by mouth daily.   Yes [provider]  Niacin, Antihyperlipidemic, (NIACIN ER, ANTIHYPERLIPIDEMIC, PO) Take 750 mg by mouth 2 (two) times daily.    Yes [provider]  Omega-3 Fatty Acids (FISH OIL CONCENTRATE PO) Take 1 capsule by mouth every morning.    Yes [provider]    Physical Exam: Vitals:   09/12/18 1330 09/12/18 1430 09/12/18 1500 09/12/18 1530  BP: (!) 148/82 127/69 113/64 118/60  Pulse: (!) 126 (!) 109 (!) 105 (!) 102  Resp: (!) 27 20 (!) 23 (!) 24  Temp:      TempSrc:      SpO2: 99% 96% 95% 94%  Weight:      Height:          Constitutional: NAD, calm, comfortable Vitals:   09/12/18 1330 09/12/18 1430 09/12/18 1500 09/12/18 1530  BP: (!) 148/82 127/69 113/64 118/60  Pulse: (!)  126 (!) 109 (!) 105 (!) 102  Resp: (!) 27 20 (!) 23 (!) 24  Temp:      TempSrc:      SpO2: 99% 96% 95% 94%  Weight:      Height:       Eyes: PERRL, lids and conjunctivae normal ENMT: Mucous membranes are dry posterior pharynx clear of any exudate or lesions.Normal dentition.  Face is quite flushed skin quite warm to touch Neck: normal, supple, no masses, no thyromegaly Respiratory: Normal effort breath sounds somewhat distant but coarse diffuse rhonchi fine crackles no wheeze Cardiovascular: Regular rate and rhythm, no murmurs / rubs / gallops. No extremity edema. 2+ pedal pulses. No carotid bruits.  Abdomen: no tenderness, no masses palpated. No hepatosplenomegaly. Bowel sounds positive.  Musculoskeletal: no clubbing / cyanosis. No joint deformity upper and lower extremities. Good ROM, no contractures. Normal muscle  tone.  Skin: no rashes, lesions, ulcers. No induration Neurologic: CN 2-12 grossly intact. Sensation intact, DTR normal. Strength 5/5 in all 4.  Psychiatric: Normal judgment and insight. Alert and oriented x 3. Normal mood.    Labs on Admission: I have personally reviewed following labs and imaging studies  CBC: Recent Labs  Lab 09/12/18 1335  WBC 13.0*  NEUTROABS 12.1*  HGB 15.1  HCT 45.9  MCV 100.2*  PLT 267*   Basic Metabolic Panel: Recent Labs  Lab 09/12/18 1335  NA 140  K 4.1  CL 104  CO2 24  GLUCOSE 107*  BUN 12  CREATININE 1.13  CALCIUM 10.4*   GFR: Estimated Creatinine Clearance: 64.4 mL/min (by C-G formula based on SCr of 1.13 mg/dL). Liver Function Tests: Recent Labs  Lab 09/12/18 1335  AST 37  ALT 17  ALKPHOS 81  BILITOT 1.5*  PROT 7.6  ALBUMIN 4.3   No results for input(s): LIPASE, AMYLASE in the last 168 hours. No results for input(s): AMMONIA in the last 168 hours. Coagulation Profile: No results for input(s): INR, PROTIME in the last 168 hours. Cardiac Enzymes: No results for input(s): CKTOTAL, CKMB, CKMBINDEX, TROPONINI in  the last 168 hours. BNP (last 3 results) No results for input(s): PROBNP in the last 8760 hours. HbA1C: No results for input(s): HGBA1C in the last 72 hours. CBG: No results for input(s): GLUCAP in the last 168 hours. Lipid Profile: No results for input(s): CHOL, HDL, LDLCALC, TRIG, CHOLHDL, LDLDIRECT in the last 72 hours. Thyroid Function Tests: No results for input(s): TSH, T4TOTAL, FREET4, T3FREE, THYROIDAB in the last 72 hours. Anemia Panel: No results for input(s): VITAMINB12, FOLATE, FERRITIN, TIBC, IRON, RETICCTPCT in the last 72 hours. Urine analysis:    Component Value Date/Time   COLORURINE YELLOW 09/12/2018 [place] 09/12/2018 1340   LABSPEC 1.009 09/12/2018 1340   PHURINE 8.0 09/12/2018 1340   GLUCOSEU NEGATIVE 09/12/2018 1340   HGBUR NEGATIVE 09/12/2018 1340   BILIRUBINUR NEGATIVE 09/12/2018 1340   KETONESUR 5 (A) 09/12/2018 1340   PROTEINUR NEGATIVE 09/12/2018 1340   NITRITE NEGATIVE 09/12/2018 1340   LEUKOCYTESUR NEGATIVE 09/12/2018 1340   Sepsis Labs: @LABRCNTIP (procalcitonin:4,lacticidven:4) )No results found for this or any previous visit (from the past 240 hour(s)).   Radiological Exams on Admission: Dg Chest 2 View  Result Date: 09/12/2018 CLINICAL DATA:  Productive cough for 1 week.  Fever and chills. EXAM: CHEST - 2 VIEW COMPARISON:  None FINDINGS: 4.1 by 5.1 cm density in the right infrahilar region primarily along the central right middle lobe bronchovascular structures. Atherosclerotic calcification of the aortic arch. Chart size within normal limits. Mild blunting of the right posterior costophrenic angle. Coronary stent noted. IMPRESSION: 1. Right infrahilar airspace opacity or mass. Given the patient's productive cough, fever, and chills, pneumonia is the favored diagnosis, but lung cancer is not excluded. Followup PA and lateral chest X-ray is recommended in 3-4 weeks following trial of antibiotic therapy to ensure resolution and  exclude underlying malignancy. Alternatively, chest CT (preferably with contrast) could be utilized to further assess. 2. Trace right pleural effusion. 3.  Aortic Atherosclerosis (ICD10-I70.0). Electronically Signed   By: Van Clines M.D.   On: 09/12/2018 14:27    EKG: Independently reviewed. Sinus tachycardia Ventricular premature complex Inferior infarct, age indeterminate  Assessment/Plan Principal Problem:   Sepsis ([place]) Active Problems:   History of hypertension   CAP (community acquired pneumonia)   Coronary atherosclerosis   History of asthma   Rheumatoid  arthritis of multiple sites with negative rheumatoid factor ([place])   #1.  Sepsis.  Secondary to community-acquired pneumonia.  Presents with temperature 102.4 orally tachycardia tachypnea leukocytosis elevated lactic acid.  Provided with 1 L of normal saline, Rocephin and azithromycin. -Admit to progressive care -Continue IV fluids -Continue Rocephin and azithromycin -sputum culture as able -Follow blood cultures -repeat lactic acid -monitor closely  2.  Community-acquired pneumonia.  Chest x-ray as noted above. Influenza panel negative.  Is not hypoxic. -Sputum culture as able -antibiotics as noted above -Will need repeat x-ray in 3 to 4 weeks to ensure resolution and exclude any underlying malignancy.  Patient is a former smoker albeit he quit 30 years ago -scheduled nebs for now -antitussive  #3.  Hypertension.  Initially blood pressure was at the high end of normal.  At the time of admission trending down somewhat.  Home medications include atenolol and lisinopril. -Hold lisinopril and atenolol for now -Her blood pressure closely -Resume home meds as indicated  4.  Rheumatoid arthritis.  Home medications include weekly methotrexate. Stable  5. CAD.  No chest pain palpitations.  EKG as noted above.  #6.  History of asthma.  Stable at baseline    DVT prophylaxis: lovenox Code Status: full Family  Communication: wife and son at bedside Disposition Plan: home when ready Consults called:  Admission status: obs  Radene Gunning NP [facility]   If 7PM-7AM, please contact night-coverage [email or web address] Password [facility]  09/12/2018, 4:09 PM

## 2018-09-12 NOTE — ED Provider Notes (Signed)
[facility] DEPARTMENT Provider Note   CSN: [id number] Arrival date & time: 09/12/18  1318     History   Chief Complaint Chief Complaint  Patient presents with  . Fever  . Chills    HPI Carlos Patton is a 77 y.o. male.  Carlos Patton is a 77 y.o. male with a history of hypertension, hyperlipidemia, MI, heart murmur, asthma, and rheumatoid arthritis on methotrexate, who presents to the emergency department for evaluation of fevers, chills, myalgias and general malaise.  He reports this morning since he woke up he is just felt very unwell, was noted to be febrile at home and took a dose of Tylenol about 30 minutes prior to arrival.  He reports that he has had a and intermittently productive cough over the past week but has not really felt ill until today.  He denies any chest pain or shortness of breath.  No abdominal pain, nausea or vomiting.  No dysuria or urinary frequency, no flank pain.  Patient reports some aching pain over his back but reports this is mild.  No numbness tingling or weakness in his extremities.  Aside from Tylenol he has not taken anything else to treat his symptoms.  No known exposures to the flu, no known sick contacts.     Past Medical History:  Diagnosis Date  . Allergy   . Arthritis   . Asthma    oc flare in the past   . Cataract   . Heart murmur   . History of colon polyps   . Hyperlipidemia   . Hypertension   . Myocardial infarction ([place])    2 MI--1980's, 1999    Patient Active Problem List   Diagnosis Date Noted  . Rheumatoid arthritis of multiple sites with negative rheumatoid factor ([place]) 03/07/2018  . Rheumatoid nodules ([place]) 03/07/2018  . High risk medication use 03/07/2018  . History of asthma 03/07/2018  . History of hypertension 03/07/2018  . History of hypercholesterolemia 03/07/2018  . History of coronary artery disease 03/07/2018  . CORONARY ARTERY DISEASE 04/27/2010  . NONSPECIFIC ABNORMAL  FINDING IN STOOL CONTENTS 04/27/2010  . PERSONAL HISTORY OF COLONIC POLYPS 04/27/2010    Past Surgical History:  Procedure Laterality Date  . CARDIAC CATHETERIZATION    . CATARACT EXTRACTION Bilateral   . COLONOSCOPY    . CORONARY STENT PLACEMENT    . INGUINAL HERNIA REPAIR Bilateral   . POLYPECTOMY          Home Medications    Prior to Admission medications   Medication Sig Start Date End Date Taking? Authorizing Provider  albuterol (PROVENTIL HFA;VENTOLIN HFA) 108 (90 Base) MCG/ACT inhaler Inhale 1 puff into the lungs every 6 (six) hours as needed for wheezing or shortness of breath. Reported on 10/06/2015    [provider]  aspirin 325 MG tablet Take 325 mg by mouth daily.     [provider]  atenolol (TENORMIN) 25 MG tablet Take 25 mg by mouth daily.  06/17/10   [provider]  budesonide-formoterol (SYMBICORT) 160-4.5 MCG/ACT inhaler as needed. Reported on 10/06/2015 02/08/13   [provider]  fluticasone (FLONASE) 50 MCG/ACT nasal spray as needed. Reported on 10/06/2015 08/06/13   [provider]  FOLIC ACID PO Take 2 capsules by mouth daily.     [provider]  lisinopril (PRINIVIL,ZESTRIL) 20 MG tablet  08/01/18   [provider]  methotrexate (RHEUMATREX) 2.5 MG tablet Take 4 tablets (12.5mg  total) by  mouth once a week (Caution: chemotherapy. Protect from light) 08/07/18   Ofilia Neas, PA-C  Multiple Vitamin (ONE-A-DAY MENS PO) Take by mouth daily.    [provider]  niacin (NIASPAN) 1000 MG CR tablet Take 1,500 mg by mouth at bedtime.    [provider]  Omega-3 Fatty Acids (FISH OIL CONCENTRATE PO) Take 1 capsule by mouth 2 (two) times daily before a meal.    [provider]    Family History Family History  Problem Relation Age of Onset  . Colon cancer Sister   . Arthritis Mother   . Stroke Father   . Heart attack Father   . Heart Problems Brother   . Heart Problems  Brother   . Gout Son   . Colon polyps Neg Hx   . Esophageal cancer Neg Hx   . Rectal cancer Neg Hx   . Stomach cancer Neg Hx     Social History Social History   Tobacco Use  . Smoking status: Former Research scientist (life sciences)  . Smokeless tobacco: Never Used  . Tobacco comment: quit 30 years ago  Substance Use Topics  . Alcohol use: No    Alcohol/week: 0.0 standard drinks  . Drug use: Never     Allergies   Patient has no known allergies.   Review of Systems Review of Systems  Constitutional: Positive for chills and fever.  HENT: Positive for congestion and rhinorrhea. Negative for sore throat.   Eyes: Negative for visual disturbance.  Respiratory: Positive for cough. Negative for chest tightness, shortness of breath and wheezing.   Cardiovascular: Negative for chest pain and leg swelling.  Gastrointestinal: Negative for abdominal pain, diarrhea, nausea and vomiting.  Genitourinary: Negative for dysuria, flank pain and frequency.  Musculoskeletal: Positive for back pain and myalgias. Negative for arthralgias.  Skin: Negative for color change and rash.  Neurological: Negative for dizziness, syncope, weakness, light-headedness, numbness and headaches.     Physical Exam Updated Vital Signs BP 127/69   Pulse (!) 114   Temp (!) 102.4 F (39.1 C) (Oral)   Resp 20   Ht 6\' 3"  (1.905 m)   Wt 95.3 kg   SpO2 96%   BMI 26.25 kg/m   Physical Exam Constitutional:      General: He is not in acute distress.    Appearance: He is normal weight. He is ill-appearing. He is not diaphoretic.     Comments: Patient appears moderately ill  HENT:     Head: Normocephalic and atraumatic.     Right Ear: Tympanic membrane normal.     Left Ear: Tympanic membrane normal.     Nose: Rhinorrhea present.     Mouth/Throat:     Mouth: Mucous membranes are moist.     Pharynx: Oropharynx is clear. No oropharyngeal exudate or posterior oropharyngeal erythema.  Eyes:     General:        Right eye: No  discharge.        Left eye: No discharge.  Neck:     Musculoskeletal: Neck supple. No neck rigidity.  Cardiovascular:     Rate and Rhythm: Regular rhythm. Tachycardia present.     Pulses: Normal pulses.     Heart sounds: Normal heart sounds. No murmur. No friction rub. No gallop.   Pulmonary:     Effort: Pulmonary effort is normal. No respiratory distress.     Breath sounds: No stridor. Wheezing and rales present.     Comments: Mildly tachypneic, but satting well on  room air and able to speak in full sentences.  On auscultation lungs with some scattered wheezes and rales present in the right lower lung field. Chest:     Chest wall: No tenderness.  Abdominal:     General: Abdomen is flat. Bowel sounds are normal. There is no distension.     Palpations: Abdomen is soft. There is no mass.     Tenderness: There is no abdominal tenderness. There is no right CVA tenderness, left CVA tenderness or guarding.     Comments: Abdomen soft, nondistended, nontender to palpation in all quadrants without guarding or peritoneal signs  Musculoskeletal:        General: No deformity.  Skin:    General: Skin is warm and dry.     Capillary Refill: Capillary refill takes less than 2 seconds.  Neurological:     Mental Status: He is alert and oriented to person, place, and time. Mental status is at baseline.  Psychiatric:        Mood and Affect: Mood normal.        Behavior: Behavior normal.      ED Treatments / Results  Labs (all labs ordered are listed, but only abnormal results are displayed) Labs Reviewed  COMPREHENSIVE METABOLIC PANEL - Abnormal; Notable for the following components:      Result Value   Glucose, Bld 107 (*)    Calcium 10.4 (*)    Total Bilirubin 1.5 (*)    All other components within normal limits  CBC WITH DIFFERENTIAL/PLATELET - Abnormal; Notable for the following components:   WBC 13.0 (*)    MCV 100.2 (*)    Platelets 141 (*)    Neutro Abs 12.1 (*)    Lymphs Abs 0.5  (*)    All other components within normal limits  URINALYSIS, ROUTINE W REFLEX MICROSCOPIC - Abnormal; Notable for the following components:   Ketones, ur 5 (*)    All other components within normal limits  I-STAT CG4 LACTIC ACID, ED - Abnormal; Notable for the following components:   Lactic Acid, Venous 2.46 (*)    All other components within normal limits  CULTURE, BLOOD (ROUTINE X 2)  CULTURE, BLOOD (ROUTINE X 2)  INFLUENZA PANEL BY PCR (TYPE A & B)  I-STAT CG4 LACTIC ACID, ED    EKG EKG Interpretation  Date/Time:  Wednesday September 12 2018 13:53:43 EST Ventricular Rate:  124 PR Interval:    QRS Duration: 107 QT Interval:  312 QTC Calculation: 449 R Axis:   85 Text Interpretation:  Sinus tachycardia Ventricular premature complex Inferior infarct, age indeterminate Confirmed by Lennice Sites [phone]) on 09/12/2018 1:57:35 PM   Radiology Dg Chest 2 View  Result Date: 09/12/2018 CLINICAL DATA:  Productive cough for 1 week.  Fever and chills. EXAM: CHEST - 2 VIEW COMPARISON:  None FINDINGS: 4.1 by 5.1 cm density in the right infrahilar region primarily along the central right middle lobe bronchovascular structures. Atherosclerotic calcification of the aortic arch. Chart size within normal limits. Mild blunting of the right posterior costophrenic angle. Coronary stent noted. IMPRESSION: 1. Right infrahilar airspace opacity or mass. Given the patient's productive cough, fever, and chills, pneumonia is the favored diagnosis, but lung cancer is not excluded. Followup PA and lateral chest X-ray is recommended in 3-4 weeks following trial of antibiotic therapy to ensure resolution and exclude underlying malignancy. Alternatively, chest CT (preferably with contrast) could be utilized to further assess. 2. Trace right pleural effusion. 3.  Aortic Atherosclerosis (ICD10-I70.0). Electronically  Signed   By: Van Clines M.D.   On: 09/12/2018 14:27    Procedures .Critical Care Performed  by: Jacqlyn Larsen, PA-C Authorized by: Jacqlyn Larsen, PA-C   Critical care provider statement:    Critical care time (minutes):  45   Critical care was necessary to treat or prevent imminent or life-threatening deterioration of the following conditions:  Sepsis   Critical care was time spent personally by me on the following activities:  Discussions with consultants, evaluation of patient's response to treatment, examination of patient, ordering and performing treatments and interventions, ordering and review of laboratory studies, ordering and review of radiographic studies, pulse oximetry, re-evaluation of patient's condition, obtaining history from patient or surrogate and review of old charts   I assumed direction of critical care for this patient from another provider in my specialty: no     (including critical care time)  Medications Ordered in ED Medications  cefTRIAXone (ROCEPHIN) 2 g in sodium chloride 0.9 % 100 mL IVPB (0 g Intravenous Stopped 09/12/18 1424)  azithromycin (ZITHROMAX) 500 mg in sodium chloride 0.9 % 250 mL IVPB (0 mg Intravenous Stopped 09/12/18 1451)  sodium chloride 0.9 % bolus 1,000 mL (0 mLs Intravenous Stopped 09/12/18 1441)     Initial Impression / Assessment and Plan / ED Course  I have reviewed the triage vital signs and the nursing notes.  Pertinent labs & imaging results that were available during my care of the patient were reviewed by me and considered in my medical decision making (see chart for details).  77 year old male presents febrile and tachycardic complaining of general malaise.  Has had an intermittently productive cough over the past week.  No chest pain or shortness of breath.  No nausea, vomiting or diarrhea no abdominal pain.  No urinary symptoms.  Patient is moderately ill-appearing, he does have some end expiratory wheezing and some rales noted in the right lower lung field concerning for pneumonia.  Code sepsis initiated.  Patient is  on methotrexate giving him an increased risk for infection.  Labs, lactic acid, blood cultures, chest x-ray and urinalysis ordered as well as EKG.  Influenza PCR sent as well.  Patient given 1 L fluid bolus, will avoid giving 30 cc/kg bolus given the patient is not hypotensive and does have history of heart issues.  Will start patient on Rocephin and azithromycin for presumed pneumonia given clinical exam.  Labs significant for lactic acid of 2.46, leukocytosis of 13 with a left shift, no acute electrolyte derangements requiring intervention, normal renal and liver function.  No evidence of urinary tract infection.  EKG shows sinus tachycardia with no concerning ischemic changes.  Chest x-ray shows a focal right infrahilar airspace opacity and given patient's history this is most likely pneumonia although lung cancer cannot be excluded.  Influenza panel was negative.  Patient will require admission for sepsis from pneumonia.  Tachycardia improving with IV fluids and blood pressure remained stable.  Will consult for unassigned medical admission.  Case discussed with Dyanne Carrel NP with [facility] who will see and admit the patient.  Patient discussed with Dr. Ronnald Nian, who saw patient as well and agrees with plan.   Final Clinical Impressions(s) / ED Diagnoses   Final diagnoses:  Sepsis due to pneumonia [facility])    ED Discharge Orders    None       Jacqlyn Larsen, [place] 09/12/18 1548    Lennice Sites, DO 09/12/18 1611

## 2018-09-12 NOTE — ED Notes (Addendum)
Istat lactic due at 1532 sent as a regular lactic acid on ice. Unable to run istats at this time.

## 2018-09-12 NOTE — ED Notes (Signed)
Pt's son Leroy Sea would like to be notified when patient gets a bed. [phone]

## 2018-09-12 NOTE — ED Triage Notes (Signed)
Patient reports productive cough x 1 week. Feeling worse yesterday and today with fever and chills. Took 2 tylenol this am. Family at bedside.

## 2018-09-13 DIAGNOSIS — R011 Cardiac murmur, unspecified: Secondary | ICD-10-CM | POA: Diagnosis present

## 2018-09-13 DIAGNOSIS — Z7982 Long term (current) use of aspirin: Secondary | ICD-10-CM | POA: Diagnosis not present

## 2018-09-13 DIAGNOSIS — Z8261 Family history of arthritis: Secondary | ICD-10-CM | POA: Diagnosis not present

## 2018-09-13 DIAGNOSIS — I700 Atherosclerosis of aorta: Secondary | ICD-10-CM | POA: Diagnosis present

## 2018-09-13 DIAGNOSIS — Z800 Family history of malignant neoplasm of digestive organs: Secondary | ICD-10-CM | POA: Diagnosis not present

## 2018-09-13 DIAGNOSIS — Z955 Presence of coronary angioplasty implant and graft: Secondary | ICD-10-CM | POA: Diagnosis not present

## 2018-09-13 DIAGNOSIS — E785 Hyperlipidemia, unspecified: Secondary | ICD-10-CM | POA: Diagnosis present

## 2018-09-13 DIAGNOSIS — Z87891 Personal history of nicotine dependence: Secondary | ICD-10-CM | POA: Diagnosis not present

## 2018-09-13 DIAGNOSIS — Z7951 Long term (current) use of inhaled steroids: Secondary | ICD-10-CM | POA: Diagnosis not present

## 2018-09-13 DIAGNOSIS — Z79899 Other long term (current) drug therapy: Secondary | ICD-10-CM | POA: Diagnosis not present

## 2018-09-13 DIAGNOSIS — I252 Old myocardial infarction: Secondary | ICD-10-CM | POA: Diagnosis not present

## 2018-09-13 DIAGNOSIS — Z8249 Family history of ischemic heart disease and other diseases of the circulatory system: Secondary | ICD-10-CM | POA: Diagnosis not present

## 2018-09-13 DIAGNOSIS — A419 Sepsis, unspecified organism: Secondary | ICD-10-CM | POA: Diagnosis present

## 2018-09-13 DIAGNOSIS — M069 Rheumatoid arthritis, unspecified: Secondary | ICD-10-CM | POA: Diagnosis present

## 2018-09-13 DIAGNOSIS — R509 Fever, unspecified: Secondary | ICD-10-CM | POA: Diagnosis present

## 2018-09-13 DIAGNOSIS — Z823 Family history of stroke: Secondary | ICD-10-CM | POA: Diagnosis not present

## 2018-09-13 DIAGNOSIS — J189 Pneumonia, unspecified organism: Secondary | ICD-10-CM | POA: Diagnosis present

## 2018-09-13 DIAGNOSIS — I2510 Atherosclerotic heart disease of native coronary artery without angina pectoris: Secondary | ICD-10-CM | POA: Diagnosis present

## 2018-09-13 DIAGNOSIS — J45909 Unspecified asthma, uncomplicated: Secondary | ICD-10-CM | POA: Diagnosis present

## 2018-09-13 DIAGNOSIS — I10 Essential (primary) hypertension: Secondary | ICD-10-CM | POA: Diagnosis present

## 2018-09-13 LAB — CBC
HCT: 38.7 % — ABNORMAL LOW (ref 39.0–52.0)
HEMOGLOBIN: 12.8 g/dL — AB (ref 13.0–17.0)
MCH: 33.2 pg (ref 26.0–34.0)
MCHC: 33.1 g/dL (ref 30.0–36.0)
MCV: 100.3 fL — ABNORMAL HIGH (ref 80.0–100.0)
Platelets: 128 10*3/uL — ABNORMAL LOW (ref 150–400)
RBC: 3.86 MIL/uL — ABNORMAL LOW (ref 4.22–5.81)
RDW: 13.8 % (ref 11.5–15.5)
WBC: 23.5 10*3/uL — ABNORMAL HIGH (ref 4.0–10.5)
nRBC: 0 % (ref 0.0–0.2)

## 2018-09-13 LAB — BASIC METABOLIC PANEL
Anion gap: 8 (ref 5–15)
BUN: 16 mg/dL (ref 8–23)
CHLORIDE: 107 mmol/L (ref 98–111)
CO2: 24 mmol/L (ref 22–32)
Calcium: 8.9 mg/dL (ref 8.9–10.3)
Creatinine, Ser: 1.43 mg/dL — ABNORMAL HIGH (ref 0.61–1.24)
GFR calc Af Amer: 54 mL/min — ABNORMAL LOW (ref >60)
GFR calc non Af Amer: 47 mL/min — ABNORMAL LOW (ref >60)
Glucose, Bld: 94 mg/dL (ref 70–99)
Potassium: 3.7 mmol/L (ref 3.5–5.1)
Sodium: 139 mmol/L (ref 135–145)

## 2018-09-13 LAB — STREP PNEUMONIAE URINARY ANTIGEN: Strep Pneumo Urinary Antigen: NEGATIVE

## 2018-09-13 MED ORDER — HEPARIN SODIUM (PORCINE) 5000 UNIT/ML IJ SOLN
5000.0000 [IU] | Freq: Three times a day (TID) | INTRAMUSCULAR | Status: DC
  Administered 2018-09-13 – 2018-09-14 (×2): 5000 [IU] via SUBCUTANEOUS
  Filled 2018-09-13 (×2): qty 1

## 2018-09-13 MED ORDER — ATENOLOL 25 MG PO TABS
12.5000 mg | ORAL_TABLET | Freq: Every day | ORAL | Status: DC
  Administered 2018-09-13 – 2018-09-14 (×2): 12.5 mg via ORAL
  Filled 2018-09-13 (×2): qty 0.5

## 2018-09-13 NOTE — Progress Notes (Signed)
Patient Demographics:    Carlos Patton, is a 77 y.o. male, DOB - 1940/12/21, PCH:[id number]  Admit date - 09/12/2018   Admitting Physician Shelda Pal, DO  Outpatient Primary MD for the patient is Velna Hatchet, MD  LOS - 0   Chief Complaint  Patient presents with  . Fever  . Chills        Subjective:    Carlos Patton today has no fevers, no emesis,  No chest pain, dyspnea on exertion persist, cough persist, chills persist,  Assessment  & Plan :    Principal Problem:   Sepsis ([place]) Active Problems:   Coronary atherosclerosis   Rheumatoid arthritis of multiple sites with negative rheumatoid factor (HCC)   History of asthma   History of hypertension   CAP (community acquired pneumonia)  Brief Summary 77 y.o. male with medical history significant for HTN, HLD, CAD with prior  MI, rheumatoid arthritis on methotrexate admitted on 09/12/2018 with fevers, chills, cough and found to have right-sided community-acquired pneumonia  Plan:- 1)Rt Sided CAP--- leukocytosis noted, fever resolving, cough persist, continue azithromycin and Rocephin along with bronchodilators and mucolytics, recheck CBC and chest x-ray in a.m.  2) sepsis--- on admission patient met sepsis criteria secondary to #1 above, he was tachypneic, tachycardic with leukocytosis fevers and transient hypoxia and transient hypotension  3)H/o CAD--- stable, no ACS symptoms at this time, BP remains somewhat soft, restart atenolol 12.5 mg daily, continue to hold lisinopril, continue aspirin 325 mg daily,  4)RA--stable, patient was on methotrexate and folic acid prior to admission  Disposition/Need for in-Hospital Stay- patient unable to be discharged at this time due to dyspnea, worsening leukocytosis, soft BP in the setting of community-acquired pneumonia requiring IV antibiotics pending culture data  Code Status :  full  Family Communication:   None available   Disposition Plan  : home in 1 to 2 days if improves  Consults  :  n/a  DVT Prophylaxis  :   Heparin - SCDS  Lab Results  Component Value Date   PLT 128 (L) 09/13/2018    Inpatient Medications  Scheduled Meds: . aspirin  325 mg Oral QHS  . cholecalciferol  1,000 Units Oral Daily  . folic acid  2 mg Oral Daily  . guaiFENesin  600 mg Oral BID  . niacin  1,000 mg Oral QHS   And  . niacin  500 mg Oral Q breakfast   Continuous Infusions: . sodium chloride 75 mL/hr at 09/12/18 1902  . azithromycin Stopped (09/12/18 1451)  . cefTRIAXone (ROCEPHIN)  IV 2 g (09/13/18 1343)   PRN Meds:.acetaminophen **OR** acetaminophen, bisacodyl, fluticasone, ondansetron **OR** ondansetron (ZOFRAN) IV, senna-docusate, traZODone    Anti-infectives (From admission, onward)   Start     Dose/Rate Route Frequency Ordered Stop   09/12/18 1745  oseltamivir (TAMIFLU) capsule 75 mg  Status:  Discontinued     75 mg Oral 2 times daily 09/12/18 1727 09/13/18 1434   09/12/18 1615  cefTRIAXone (ROCEPHIN) 1 g in sodium chloride 0.9 % 100 mL IVPB  Status:  Discontinued     1 g 200 mL/hr over 30 Minutes Intravenous Every 24 hours 09/12/18 1605 09/12/18 1607   09/12/18 1615  azithromycin (ZITHROMAX) 500 mg in sodium chloride  0.9 % 250 mL IVPB  Status:  Discontinued     500 mg 250 mL/hr over 60 Minutes Intravenous Every 24 hours 09/12/18 1605 09/12/18 1607   09/12/18 1345  cefTRIAXone (ROCEPHIN) 2 g in sodium chloride 0.9 % 100 mL IVPB     2 g 200 mL/hr over 30 Minutes Intravenous Every 24 hours 09/12/18 1333     09/12/18 1345  azithromycin (ZITHROMAX) 500 mg in sodium chloride 0.9 % 250 mL IVPB     500 mg 250 mL/hr over 60 Minutes Intravenous Every 24 hours 09/12/18 1333          Objective:   Vitals:   09/12/18 1829 09/12/18 2111 09/12/18 2111 09/13/18 0516  BP: (!) 106/52  (!) 111/57 (!) 99/53  Pulse:  72 74 64  Resp:  '20 20 16  ' Temp:   99.4 F (37.4  C) 98.7 F (37.1 C)  TempSrc:   Oral Oral  SpO2:  98% 96% 98%  Weight:      Height:        Wt Readings from Last 3 Encounters:  09/12/18 94.6 kg  08/07/18 98.9 kg  06/22/18 96.2 kg     Intake/Output Summary (Last 24 hours) at 09/13/2018 1436 Last data filed at 09/13/2018 6314 Gross per 24 hour  Intake 2967.59 ml  Output -  Net 2967.59 ml     Physical Exam Patient is examined daily including today on 09/13/18 , exams remain the same as of yesterday except that has changed   Gen:- Awake Alert,  In no apparent distress  HEENT:- [place].AT, No sclera icterus Neck-Supple Neck,No JVD,.  Lungs-diminished in bases right more than left with scattered rhonchi, very few wheezes in upper lung fields CV- S1, S2 normal, regular  Abd-  +ve B.Sounds, Abd Soft, No tenderness,    Extremity/Skin:- No  edema, pedal pulses present  Psych-affect is appropriate, oriented x3 Neuro-no new focal deficits, no tremors   Data Review:   Micro Results Recent Results (from the past 240 hour(s))  Blood Culture (routine x 2)     Status: None (Preliminary result)   Collection Time: 09/12/18  1:32 PM  Result Value Ref Range Status   Specimen Description BLOOD BLOOD RIGHT WRIST  Final   Special Requests   Final    BOTTLES DRAWN AEROBIC AND ANAEROBIC Blood Culture adequate volume   Culture   Final    NO GROWTH < 24 HOURS Performed at [facility], [place] [address], [place], [place] [address]    Report Status PENDING  Incomplete  Blood Culture (routine x 2)     Status: None (Preliminary result)   Collection Time: 09/12/18  1:37 PM  Result Value Ref Range Status   Specimen Description BLOOD LEFT ANTECUBITAL  Final   Special Requests   Final    BOTTLES DRAWN AEROBIC AND ANAEROBIC Blood Culture adequate volume   Culture   Final    NO GROWTH < 24 HOURS Performed at [facility], [place] [address], [place], [place] [address]    Report Status PENDING  Incomplete    Radiology Reports Dg Chest  2 View  Result Date: 09/12/2018 CLINICAL DATA:  Productive cough for 1 week.  Fever and chills. EXAM: CHEST - 2 VIEW COMPARISON:  None FINDINGS: 4.1 by 5.1 cm density in the right infrahilar region primarily along the central right middle lobe bronchovascular structures. Atherosclerotic calcification of the aortic arch. Chart size within normal limits. Mild blunting of the right  posterior costophrenic angle. Coronary stent noted. IMPRESSION: 1. Right infrahilar airspace opacity or mass. Given the patient's productive cough, fever, and chills, pneumonia is the favored diagnosis, but lung cancer is not excluded. Followup PA and lateral chest X-ray is recommended in 3-4 weeks following trial of antibiotic therapy to ensure resolution and exclude underlying malignancy. Alternatively, chest CT (preferably with contrast) could be utilized to further assess. 2. Trace right pleural effusion. 3.  Aortic Atherosclerosis (ICD10-I70.0). Electronically Signed   By: Van Clines M.D.   On: 09/12/2018 14:27     CBC Recent Labs  Lab 09/12/18 1335 09/13/18 0337  WBC 13.0* 23.5*  HGB 15.1 12.8*  HCT 45.9 38.7*  PLT 141* 128*  MCV 100.2* 100.3*  MCH 33.0 33.2  MCHC 32.9 33.1  RDW 13.5 13.8  LYMPHSABS 0.5*  --   MONOABS 0.4  --   EOSABS 0.1  --   BASOSABS 0.0  --     Chemistries  Recent Labs  Lab 09/12/18 1335 09/13/18 0337  NA 140 139  K 4.1 3.7  CL 104 107  CO2 24 24  GLUCOSE 107* 94  BUN 12 16  CREATININE 1.13 1.43*  CALCIUM 10.4* 8.9  AST 37  --   ALT 17  --   ALKPHOS 81  --   BILITOT 1.5*  --    ------------------------------------------------------------------------------------------------------------------ No results for input(s): CHOL, HDL, LDLCALC, TRIG, CHOLHDL, LDLDIRECT in the last 72 hours.  No results found for: HGBA1C ------------------------------------------------------------------------------------------------------------------ No results for input(s): TSH,  T4TOTAL, T3FREE, THYROIDAB in the last 72 hours.  Invalid input(s): FREET3 ------------------------------------------------------------------------------------------------------------------ No results for input(s): VITAMINB12, FOLATE, FERRITIN, TIBC, IRON, RETICCTPCT in the last 72 hours.  Coagulation profile No results for input(s): INR, PROTIME in the last 168 hours.  No results for input(s): DDIMER in the last 72 hours.  Cardiac Enzymes No results for input(s): CKMB, TROPONINI, MYOGLOBIN in the last 168 hours.  Invalid input(s): CK ------------------------------------------------------------------------------------------------------------------ No results found for: BNP   Roxan Hockey M.D on 09/13/2018 at 2:36 PM  Pager---[phone] Go to [email or web address] - password [email or web address] for contact info  [facility] - Office  [phone]

## 2018-09-13 NOTE — Evaluation (Signed)
Physical Therapy Evaluation Patient Details Name: Carlos Patton MRN: [id number] DOB: 1941/07/01 Today's Date: 09/13/2018   History of Present Illness  Patient is a 77 y/o male who presents with cough, fever, chills, SOB. FOund to have sepsis secondary to PNA. PMH includes MI, HTN, HLD.  Clinical Impression  Patient tolerated transfers and gait training Mod I without difficulty. Demonstrated 2/4 DOE but Sp02 remained >95% on RA. 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 Reports feeling back to baseline except his cough. Encouraged walking while in the hospital to maintain strength/mobility. Does not require skilled therapy services. All education completed. Discharge from therapy.    Follow Up Recommendations No PT follow up    Equipment Recommendations  None recommended by PT    Recommendations for Other Services       Precautions / Restrictions Precautions Precautions: None Restrictions Weight Bearing Restrictions: No      Mobility  Bed Mobility               General bed mobility comments: up in chair upon PT arrival.   Transfers Overall transfer level: Modified independent Equipment used: None             General transfer comment: Stood from chair without difficulty.   Ambulation/Gait Ambulation/Gait assistance: Modified independent (Device/Increase time) Gait Distance (Feet): 300 Feet Assistive device: IV Pole Gait Pattern/deviations: Step-through pattern;Decreased stride length     General Gait Details: Steady gait. 2/4 DOE. Sp02 remained >95% on RA. HR up to 102 bpm.  Stairs            Wheelchair Mobility    Modified Rankin (Stroke Patients Only)       Balance Overall balance assessment: No apparent balance deficits (not formally assessed)                                           Pertinent Vitals/Pain Pain Assessment: No/denies pain    Home Living Family/patient expects to be  discharged to:: Private residence Living Arrangements: Spouse/significant other Available Help at Discharge: Family;Available 24 hours/day Type of Home: House Home Access: Level entry     Home Layout: One level Home Equipment: None      Prior Function Level of Independence: Independent         Comments: Works on the farm, 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     Hand Dominance        Extremity/Trunk Assessment   Upper Extremity Assessment Upper Extremity Assessment: Defer to OT evaluation    Lower Extremity Assessment Lower Extremity Assessment: Overall WFL for tasks assessed       Communication   Communication: No difficulties  Cognition Arousal/Alertness: Awake/alert Behavior During Therapy: WFL for tasks assessed/performed Overall Cognitive Status: Within Functional Limits for tasks assessed                                        General Comments General comments (skin integrity, edema, etc.): Wife present during session.    Exercises     Assessment/Plan    PT Assessment Patent does not need any further PT services  PT Problem List         PT Treatment Interventions      PT Goals (Current goals can be  found in the Care Plan section)  Acute Rehab PT Goals PT Goal Formulation: All assessment and education complete, DC therapy    Frequency     Barriers to discharge        Co-evaluation               AM-PAC PT "6 Clicks" Mobility  Outcome Measure Help needed turning from your back to your side while in a flat bed without using bedrails?: None Help needed moving from lying on your back to sitting on the side of a flat bed without using bedrails?: None Help needed moving to and from a bed to a chair (including a wheelchair)?: None Help needed standing up from a chair using your arms (e.g., wheelchair or bedside chair)?: None Help needed to walk in hospital room?: None Help needed climbing 3-5 steps with a railing? : A  Little 6 Click Score: 23    End of Session Equipment Utilized During Treatment: Gait belt Activity Tolerance: Patient tolerated treatment well Patient left: in chair;with call bell/phone within reach;with family/visitor present Nurse Communication: Mobility status PT Visit Diagnosis: Unsteadiness on feet (R26.81)    Time: 4540-9811 PT Time Calculation (min) (ACUTE ONLY): 11 min   Charges:   PT Evaluation $PT Eval Low Complexity: 1 Low          Wray Kearns, PT, DPT Acute Rehabilitation Services Pager [phone] Office [phone]      Marguarite Arbour A Sabra Heck 09/13/2018, 11:56 AM

## 2018-09-14 ENCOUNTER — Inpatient Hospital Stay (HOSPITAL_COMMUNITY): Payer: PPO

## 2018-09-14 LAB — CBC
HCT: 37.5 % — ABNORMAL LOW (ref 39.0–52.0)
Hemoglobin: 12 g/dL — ABNORMAL LOW (ref 13.0–17.0)
MCH: 31.9 pg (ref 26.0–34.0)
MCHC: 32 g/dL (ref 30.0–36.0)
MCV: 99.7 fL (ref 80.0–100.0)
PLATELETS: 119 10*3/uL — AB (ref 150–400)
RBC: 3.76 MIL/uL — ABNORMAL LOW (ref 4.22–5.81)
RDW: 13.6 % (ref 11.5–15.5)
WBC: 15.4 10*3/uL — AB (ref 4.0–10.5)
nRBC: 0 % (ref 0.0–0.2)

## 2018-09-14 LAB — BASIC METABOLIC PANEL
Anion gap: 11 (ref 5–15)
BUN: 17 mg/dL (ref 8–23)
CO2: 19 mmol/L — ABNORMAL LOW (ref 22–32)
Calcium: 8.7 mg/dL — ABNORMAL LOW (ref 8.9–10.3)
Chloride: 110 mmol/L (ref 98–111)
Creatinine, Ser: 1.1 mg/dL (ref 0.61–1.24)
GFR calc Af Amer: >60 mL/min (ref >60)
GFR calc non Af Amer: >60 mL/min (ref >60)
Glucose, Bld: 84 mg/dL (ref 70–99)
Potassium: 3.7 mmol/L (ref 3.5–5.1)
SODIUM: 140 mmol/L (ref 135–145)

## 2018-09-14 MED ORDER — AZITHROMYCIN 500 MG PO TABS: 500.0000 mg | ORAL_TABLET | Freq: Every day | ORAL | 0 refills | Status: AC

## 2018-09-14 MED ORDER — NIACIN ER 500 MG PO CPCR: 500.0000 mg | ORAL_CAPSULE | Freq: Every day | ORAL | 2 refills | Status: DC

## 2018-09-14 MED ORDER — NIACIN ER 500 MG PO CPCR: 1000.0000 mg | ORAL_CAPSULE | Freq: Every day | ORAL | 2 refills | Status: DC

## 2018-09-14 MED ORDER — GUAIFENESIN ER 600 MG PO TB12: 600.0000 mg | ORAL_TABLET | Freq: Two times a day (BID) | ORAL | 0 refills | Status: DC

## 2018-09-14 MED ORDER — CEFDINIR 300 MG PO CAPS: 300.0000 mg | ORAL_CAPSULE | Freq: Two times a day (BID) | ORAL | 0 refills | Status: AC

## 2018-09-14 MED ORDER — ONDANSETRON HCL 4 MG PO TABS: 4.0000 mg | ORAL_TABLET | Freq: Four times a day (QID) | ORAL | 0 refills | Status: DC | PRN

## 2018-09-14 NOTE — Discharge Instructions (Signed)
1) you have a spot in your right lung that is possibly pneumonia but malignancy/Cancer cannot be completely excluded----you need to follow-up with your primary care physician in 4 to 6 weeks to repeat a chest x-ray to see if this point is gone after completing antibiotics.... If after finishing antibiotics and after 4 to 6 weeks chest x-ray continues to show this point in the right lung he would need a CT of the chest with contrast to exclude possible malignancy/cancer 2) take all other medications as prescribed--

## 2018-09-14 NOTE — Consult Note (Signed)
            [facility] CM Primary Care Navigator  09/14/2018  Carlos Patton October 17, 1940 [id number]   Attemptto see patient at the bedside to identify possible discharge needs but she wasalready discharged home.  Per MD note, patient was admitted for complaints of fever, chills, intermittent nonproductive cough, general malaise and evaluation revealed sepsis likely related to community-acquired pneumonia. (Sepsis due to pneumonia- community acquired pneumonia)  Patient has discharge instruction to follow-up with primary care provider post discharge and a follow-up PA and lateral chest X-ray is recommended in 3- 4 weeks following trial of antibiotic therapy to ensure resolution and exclude underlying malignancy.  Primary care provider's office is listed as providing transition of care (TOC) follow-up.   For additional questions please contact:  Edwena Felty A. Noriah Osgood, BSN, RN-BC [facility] PRIMARY CARE Navigator Cell: [phone]

## 2018-09-14 NOTE — Discharge Summary (Signed)
MCCADE Patton, is a 77 y.o. male  DOB November 12, 1940  MRN [id number].  Admission date:  09/12/2018  Admitting Physician  Shelda Pal, DO  Discharge Date:  09/14/2018   Primary MD  Velna Hatchet, MD  Recommendations for primary care physician for things to follow:  1) you have a spot in your right lung that is possibly pneumonia but malignancy/Cancer cannot be completely excluded----you need to follow-up with your primary care physician in 4 to 6 weeks to repeat a chest x-ray to see if this point is gone after completing antibiotics.... If after finishing antibiotics and after 4 to 6 weeks chest x-ray continues to show this point in the right lung he would need a CT of the chest with contrast to exclude possible malignancy/cancer 2) take all other medications as prescribed--   Admission Diagnosis  Sepsis due to pneumonia ([place]) [J18.9, A41.9]   Discharge Diagnosis  Sepsis due to pneumonia ([place]) [J18.9, A41.9]    Principal Problem:   Sepsis ([place]) Active Problems:   Coronary atherosclerosis   Rheumatoid arthritis of multiple sites with negative rheumatoid factor ([place])   History of asthma   History of hypertension   CAP (community acquired pneumonia)      Past Medical History:  Diagnosis Date  . Allergy   . Arthritis   . Asthma    oc flare in the past   . CAP (community acquired pneumonia)   . Cataract   . Heart murmur   . History of colon polyps   . Hyperlipidemia   . Hypertension   . Myocardial infarction ([place])    2 MI--1980's, 1999  . Sepsis [facility])     Past Surgical History:  Procedure Laterality Date  . CARDIAC CATHETERIZATION    . CATARACT EXTRACTION Bilateral   . COLONOSCOPY    . CORONARY STENT PLACEMENT    . INGUINAL HERNIA REPAIR Bilateral   . POLYPECTOMY         HPI  from the history and physical done on the day of admission:     Chief Complaint: fever  chills  HPI: Carlos Patton is a 77 y.o. male with medical history significant for pretension, hyperlipidemia, MI, rheumatoid arthritis on methotrexate presents emergency department chief complaint fever chills intermittent nonproductive cough general malaise.  Initial evaluation revealed sepsis likely related to community-acquired pneumonia.  [facility] hospitalist are asked to admit  Information is obtained from the patient and his wife is at the bedside.  Patient states he started with a sore throat intermittently about a week ago.  That progressed to a moist sounding nonproductive cough.  Yesterday he felt like he had a fever "off and on".  He describes chills and general aches "all over".  This morning felt very unwell and took Tylenol.  He denies chest pain palpitations.  He does endorse some shortness of breath with exertion but denies orthopnea or lower extremity edema.  He denies abdominal pain nausea vomiting but does endorse decreased appetite.  He denies dysuria hematuria frequency  urgency.  He denies diarrhea constipation melena bright red blood per rectum.  Denies headache dizziness syncope or near syncope.  ED Course: In the emergency department he has a temperature of 102.8 orally a leukocytosis elevated lactic acid tachycardia tachypnea.  He is not hypoxic provided with IV fluids IV antibiotics and Tylenol.  Chest x-rayRight infrahilar airspace opacity or mass. Given the patient's productive cough, fever, and chills, pneumonia is the favored diagnosis, but lung cancer is not excluded. Followup PA and lateral chest X-ray is recommended in 3-4 weeks following trial of antibiotic therapy to ensure resolution and exclude underlying malignancy. Alternatively, chest CT (preferably with contrast) could be utilized to further assess. Trace right pleural effusion. Aortic Atherosclerosis     Hospital Course:     Brief Summary 77 y.o.malewith medical history significantforHTN, HLD, CAD with  prior  MI, rheumatoid arthritis on methotrexate admitted on 09/12/2018 with fevers, chills, cough and found to have right-sided community-acquired pneumonia  Plan:- 1)Rt Sided CAP--- leukocytosis improved, white count is down to 15 from 23.5, afebrile, cough is improving, no dyspnea at rest, no dyspnea on exertion, post ablation O2 sats in the mid 90s on room air, treated with IV azithromycin and Rocephin along with bronchodilators and mucolytics, okay to discharge home on p.o. Omnicef and azithromycin.  Chest x-ray shows right lung opacity that is possibly pneumonia but malignancy/Cancer cannot be completely excluded---patient will need to follow-up with  primary care physician in 4 to 6 weeks to repeat a chest x-ray to see if this does not resolve after completing antibiotics.... If after finishing antibiotics and after 4 to 6 weeks chest x-ray continues to show this point in the right lung he would need a CT of the chest with contrast to exclude possible malignancy/cancer  2)Sepsis--- on admission patient met sepsis criteria secondary to #1 above, sepsis pathophysiology has resolved .  3)H/o CAD--- stable, no ACS symptoms at this time, BP is improved, okay to discharge on home dose of  atenolol 12.5 mg daily, may also restart lisinopril, continue aspirin 325 mg daily,  4)RA--stable, patient was on methotrexate and folic acid prior to admission   Code Status : full  Family Communication:   None available  Disposition Plan  : home   Consults  :  n/a  Discharge Condition: stable  Follow UP  Diet and Activity recommendation:  As advised  Discharge Instructions    Discharge Instructions    Call MD for:  difficulty breathing, headache or visual disturbances   Complete by:  As directed    Call MD for:  persistant dizziness or light-headedness   Complete by:  As directed    Call MD for:  persistant nausea and vomiting   Complete by:  As directed    Call MD for:  temperature  >100.4   Complete by:  As directed    Diet - low sodium heart healthy   Complete by:  As directed    Discharge instructions   Complete by:  As directed    1) you have a spot in your right lung that is possibly pneumonia but malignancy/Cancer cannot be completely excluded----you need to follow-up with your primary care physician in 4 to 6 weeks to repeat a chest x-ray to see if this point is gone after completing antibiotics.... If after finishing antibiotics and after 4 to 6 weeks chest x-ray continues to show this point in the right lung he would need a CT of the chest with contrast to exclude possible  malignancy/cancer 2) take all other medications as prescribed--   Increase activity slowly   Complete by:  As directed         Discharge Medications     Allergies as of 09/14/2018   No Known Allergies     Medication List    TAKE these medications   acetaminophen 325 MG tablet Commonly known as:  TYLENOL Take by mouth every 6 (six) hours as needed for mild pain or moderate pain.   albuterol 108 (90 Base) MCG/ACT inhaler Commonly known as:  PROVENTIL HFA;VENTOLIN HFA Inhale 1 puff into the lungs every 6 (six) hours as needed for wheezing or shortness of breath. Reported on 10/06/2015   albuterol (2.5 MG/3ML) 0.083% nebulizer solution Commonly known as:  PROVENTIL Take 2.5 mg by nebulization every 6 (six) hours as needed for wheezing or shortness of breath.   aspirin 325 MG tablet Take 325 mg by mouth at bedtime.   atenolol 25 MG tablet Commonly known as:  TENORMIN Take 25 mg by mouth daily.   azithromycin 500 MG tablet Commonly known as:  ZITHROMAX Take 1 tablet (500 mg total) by mouth daily for 5 days.   cefdinir 300 MG capsule Commonly known as:  OMNICEF Take 1 capsule (300 mg total) by mouth 2 (two) times daily for 7 days.   FISH OIL CONCENTRATE PO Take 1 capsule by mouth every morning.   fluticasone 50 MCG/ACT nasal spray Commonly known as:  FLONASE Place 1  spray into both nostrils as needed for allergies or rhinitis. Reported on 8/78/6767   folic acid 1 MG tablet Commonly known as:  FOLVITE Take 2 mg by mouth daily.   guaiFENesin 600 MG 12 hr tablet Commonly known as:  MUCINEX Take 1 tablet (600 mg total) by mouth 2 (two) times daily.   lisinopril 20 MG tablet Commonly known as:  PRINIVIL,ZESTRIL Take 20 mg by mouth at bedtime.   MAGNESIUM OXIDE PO Take 1 tablet by mouth at bedtime.   methotrexate 2.5 MG tablet Commonly known as:  RHEUMATREX Take 4 tablets (12.40m total) by mouth once a week (Caution: chemotherapy. Protect from light)   niacin 500 MG CR capsule Take 2 capsules (1,000 mg total) by mouth at bedtime.   niacin 500 MG CR capsule Take 1 capsule (500 mg total) by mouth daily with breakfast. Start taking on:  September 15, 2018   NIACIN ER (ANTIHYPERLIPIDEMIC) PO Take 750 mg by mouth 2 (two) times daily.   ondansetron 4 MG tablet Commonly known as:  ZOFRAN Take 1 tablet (4 mg total) by mouth every 6 (six) hours as needed for nausea or vomiting.   ONE-A-DAY MENS PO Take by mouth daily.   SYMBICORT 160-4.5 MCG/ACT inhaler Generic drug:  budesonide-formoterol Inhale 2 puffs into the lungs as needed (Shortness of breath). Reported on 10/06/2015   VITAMIN D3 PO Take 2 tablets by mouth every morning.       Major procedures and Radiology Reports - PLEASE review detailed and final reports for all details, in brief -    Dg Chest 2 View  Result Date: 09/14/2018 CLINICAL DATA:  Shortness of breath EXAM: CHEST - 2 VIEW COMPARISON:  September 12, 2018 FINDINGS: There is persistent opacity in the right infrahilar region, stable. There is a minimal right pleural effusion. The lungs elsewhere are clear. Heart size and pulmonary vascular normal. No adenopathy. Aorta is tortuous with aortic atherosclerosis. No bone lesions. IMPRESSION: Persistent right infrahilar opacity, stable from 2 days prior. Minimal right  pleural  effusion. No new opacity. Stable cardiac silhouette. There is aortic atherosclerosis. It should be noted that while pneumonia is statistically the most likely etiology for this infrahilar opacity on the right, a neoplastic etiology can not be excluded. In this regard, it may be prudent to consider contrast enhanced chest CT to further assess. Aortic Atherosclerosis (ICD10-I70.0). Electronically Signed   By: Lowella Grip III M.D.   On: 09/14/2018 07:56   Dg Chest 2 View  Result Date: 09/12/2018 CLINICAL DATA:  Productive cough for 1 week.  Fever and chills. EXAM: CHEST - 2 VIEW COMPARISON:  None FINDINGS: 4.1 by 5.1 cm density in the right infrahilar region primarily along the central right middle lobe bronchovascular structures. Atherosclerotic calcification of the aortic arch. Chart size within normal limits. Mild blunting of the right posterior costophrenic angle. Coronary stent noted. IMPRESSION: 1. Right infrahilar airspace opacity or mass. Given the patient's productive cough, fever, and chills, pneumonia is the favored diagnosis, but lung cancer is not excluded. Followup PA and lateral chest X-ray is recommended in 3-4 weeks following trial of antibiotic therapy to ensure resolution and exclude underlying malignancy. Alternatively, chest CT (preferably with contrast) could be utilized to further assess. 2. Trace right pleural effusion. 3.  Aortic Atherosclerosis (ICD10-I70.0). Electronically Signed   By: Van Clines M.D.   On: 09/12/2018 14:27    Micro Results    Recent Results (from the past 240 hour(s))  Blood Culture (routine x 2)     Status: None (Preliminary result)   Collection Time: 09/12/18  1:32 PM  Result Value Ref Range Status   Specimen Description BLOOD BLOOD RIGHT WRIST  Final   Special Requests   Final    BOTTLES DRAWN AEROBIC AND ANAEROBIC Blood Culture adequate volume   Culture   Final    NO GROWTH < 24 HOURS Performed at [facility], [place] [address] [place], [place] [address]    Report Status PENDING  Incomplete  Blood Culture (routine x 2)     Status: None (Preliminary result)   Collection Time: 09/12/18  1:37 PM  Result Value Ref Range Status   Specimen Description BLOOD LEFT ANTECUBITAL  Final   Special Requests   Final    BOTTLES DRAWN AEROBIC AND ANAEROBIC Blood Culture adequate volume   Culture   Final    NO GROWTH < 24 HOURS Performed at [facility], [place] [address], [place], [place] [address]    Report Status PENDING  Incomplete       Today   Subjective    Samrat Hayward today has no concerns, cough is much better, ambulating around without dyspnea on exertion, post ambulation O2 sats in the mid 90s, no fevers, no chills,          Patient has been seen and examined prior to discharge   Objective   Blood pressure (!) 142/84, pulse 75, temperature 98.2 F (36.8 C), temperature source Oral, resp. rate 18, height '6\' 2"'  (1.88 m), weight 94.6 kg, SpO2 97 %.  No intake or output data in the 24 hours ending 09/14/18 1037  Exam Gen:- Awake Alert,  In no apparent distress, speaking in complete sentences  HEENT:- [place].AT, No sclera icterus Neck-Supple Neck,No JVD,.  Lungs-improving air movement especially on the right, , few scattered rhonchi, no wheezing no rales  CV- S1, S2 normal, regular  Abd-  +ve B.Sounds, Abd Soft, No tenderness,    Extremity/Skin:- No  edema, pedal pulses present  Psych-affect is appropriate, oriented x3 Neuro-no new focal deficits, no tremors   Data Review   CBC w Diff:  Lab Results  Component Value Date   WBC 15.4 (H) 09/14/2018   HGB 12.0 (L) 09/14/2018   HGB 12.6 (L) 05/29/2017   HCT 37.5 (L) 09/14/2018   HCT 37.7 05/29/2017   PLT 119 (L) 09/14/2018   PLT 306 05/29/2017   LYMPHOPCT 4 09/12/2018   MONOPCT 3 09/12/2018   EOSPCT 1 09/12/2018   BASOPCT 0 09/12/2018    CMP:  Lab Results  Component Value Date   NA 140 09/14/2018   NA 140 05/29/2017   K 3.7 09/14/2018    CL 110 09/14/2018   CO2 19 (L) 09/14/2018   BUN 17 09/14/2018   BUN 11 05/29/2017   CREATININE 1.10 09/14/2018   CREATININE 0.92 06/07/2018   PROT 7.6 09/12/2018   PROT 6.1 05/29/2017   ALBUMIN 4.3 09/12/2018   ALBUMIN 3.6 05/29/2017   BILITOT 1.5 (H) 09/12/2018   BILITOT 0.2 05/29/2017   ALKPHOS 81 09/12/2018   AST 37 09/12/2018   ALT 17 09/12/2018  .   Total Discharge time is about 33 minutes  Roxan Hockey M.D on 09/14/2018 at 10:37 AM  Pager---[phone]  Go to [email or web address] - password [email or web address] for contact info  [facility] - Office  [phone]

## 2018-09-14 NOTE — Progress Notes (Signed)
Carlos Patton to be D/C'd Home per MD order.  Discussed with the patient and all questions fully answered.  VSS, Skin clean, dry and intact without evidence of skin break down, no evidence of skin tears noted. IV catheter discontinued intact. Site without signs and symptoms of complications. Dressing and pressure applied.  An After Visit Summary was printed and given to the patient. Patient received prescription.  D/c education completed with patient/family including follow up instructions, medication list, d/c activities limitations if indicated, with other d/c instructions as indicated by MD - patient able to verbalize understanding, all questions fully answered.   Patient instructed to return to ED, call 911, or call MD for any changes in condition.   Patient escorted to exit and D/C home via private auto.  Howard Pouch 09/14/2018 1:36 PM

## 2018-09-17 LAB — CULTURE, BLOOD (ROUTINE X 2)
Culture: NO GROWTH
Culture: NO GROWTH
Special Requests: ADEQUATE
Special Requests: ADEQUATE

## 2018-10-01 DIAGNOSIS — I10 Essential (primary) hypertension: Secondary | ICD-10-CM | POA: Diagnosis not present

## 2018-10-01 DIAGNOSIS — Z6828 Body mass index (BMI) 28.0-28.9, adult: Secondary | ICD-10-CM | POA: Diagnosis not present

## 2018-10-01 DIAGNOSIS — I25118 Atherosclerotic heart disease of native coronary artery with other forms of angina pectoris: Secondary | ICD-10-CM | POA: Diagnosis not present

## 2018-10-01 DIAGNOSIS — J189 Pneumonia, unspecified organism: Secondary | ICD-10-CM | POA: Diagnosis not present

## 2018-10-01 DIAGNOSIS — A419 Sepsis, unspecified organism: Secondary | ICD-10-CM | POA: Diagnosis not present

## 2018-10-01 DIAGNOSIS — M069 Rheumatoid arthritis, unspecified: Secondary | ICD-10-CM | POA: Diagnosis not present

## 2018-10-18 DIAGNOSIS — H353132 Nonexudative age-related macular degeneration, bilateral, intermediate dry stage: Secondary | ICD-10-CM | POA: Diagnosis not present

## 2018-10-18 DIAGNOSIS — H26493 Other secondary cataract, bilateral: Secondary | ICD-10-CM | POA: Diagnosis not present

## 2018-10-18 DIAGNOSIS — D3132 Benign neoplasm of left choroid: Secondary | ICD-10-CM | POA: Diagnosis not present

## 2018-10-18 DIAGNOSIS — Z961 Presence of intraocular lens: Secondary | ICD-10-CM | POA: Diagnosis not present

## 2018-10-22 DIAGNOSIS — Z6828 Body mass index (BMI) 28.0-28.9, adult: Secondary | ICD-10-CM | POA: Diagnosis not present

## 2018-10-22 DIAGNOSIS — I10 Essential (primary) hypertension: Secondary | ICD-10-CM | POA: Diagnosis not present

## 2018-10-22 DIAGNOSIS — J189 Pneumonia, unspecified organism: Secondary | ICD-10-CM | POA: Diagnosis not present

## 2018-11-02 ENCOUNTER — Other Ambulatory Visit: Payer: Self-pay

## 2018-11-02 DIAGNOSIS — Z79899 Other long term (current) drug therapy: Secondary | ICD-10-CM | POA: Diagnosis not present

## 2018-11-03 LAB — COMPLETE METABOLIC PANEL WITH GFR
AG Ratio: 1.9 (calc) (ref 1.0–2.5)
ALT: 11 U/L (ref 9–46)
AST: 21 U/L (ref 10–35)
Albumin: 4 g/dL (ref 3.6–5.1)
Alkaline phosphatase (APISO): 73 U/L (ref 35–144)
BUN/Creatinine Ratio: 14 (calc) (ref 6–22)
BUN: 17 mg/dL (ref 7–25)
CO2: 28 mmol/L (ref 20–32)
Calcium: 9.8 mg/dL (ref 8.6–10.3)
Chloride: 106 mmol/L (ref 98–110)
Creat: 1.21 mg/dL — ABNORMAL HIGH (ref 0.70–1.18)
GFR, Est African American: 66 mL/min/{1.73_m2} (ref >=60)
GFR, Est Non African American: 57 mL/min/{1.73_m2} — ABNORMAL LOW (ref >=60)
Globulin: 2.1 g/dL (calc) (ref 1.9–3.7)
Glucose, Bld: 65 mg/dL (ref 65–99)
Potassium: 4.3 mmol/L (ref 3.5–5.3)
Sodium: 141 mmol/L (ref 135–146)
Total Bilirubin: 0.9 mg/dL (ref 0.2–1.2)
Total Protein: 6.1 g/dL (ref 6.1–8.1)

## 2018-11-03 LAB — CBC WITH DIFFERENTIAL/PLATELET
Absolute Monocytes: 479 cells/uL (ref 200–950)
Basophils Absolute: 31 cells/uL (ref 0–200)
Basophils Relative: 0.6 %
Eosinophils Absolute: 199 cells/uL (ref 15–500)
Eosinophils Relative: 3.9 %
HCT: 41.1 % (ref 38.5–50.0)
HEMOGLOBIN: 13.9 g/dL (ref 13.2–17.1)
Lymphs Abs: 923 cells/uL (ref 850–3900)
MCH: 33 pg (ref 27.0–33.0)
MCHC: 33.8 g/dL (ref 32.0–36.0)
MCV: 97.6 fL (ref 80.0–100.0)
MONOS PCT: 9.4 %
MPV: 12 fL (ref 7.5–12.5)
Neutro Abs: 3468 cells/uL (ref 1500–7800)
Neutrophils Relative %: 68 %
Platelets: 134 10*3/uL — ABNORMAL LOW (ref 140–400)
RBC: 4.21 10*6/uL (ref 4.20–5.80)
RDW: 12.8 % (ref 11.0–15.0)
Total Lymphocyte: 18.1 %
WBC: 5.1 10*3/uL (ref 3.8–10.8)

## 2018-11-05 NOTE — Progress Notes (Signed)
Ms. are stable.  We will continue to monitor labs.

## 2018-11-17 ENCOUNTER — Encounter: Payer: Self-pay | Admitting: Gastroenterology

## 2018-12-12 ENCOUNTER — Telehealth: Payer: Self-pay | Admitting: Rheumatology

## 2018-12-12 MED ORDER — METHOTREXATE 2.5 MG PO TABS: ORAL_TABLET | ORAL | 0 refills | Status: DC

## 2018-12-12 NOTE — Telephone Encounter (Signed)
Patient left a voicemail requesting prescription refill of Methotrexate.   

## 2018-12-12 NOTE — Telephone Encounter (Signed)
Last Visit: 08/07/18 Next Visit: 01/08/19 Labs: 11/02/18 stable  Okay to refill per Dr. Estanislado Pandy

## 2018-12-19 ENCOUNTER — Telehealth: Payer: Self-pay | Admitting: Rheumatology

## 2018-12-19 MED ORDER — METHOTREXATE 2.5 MG PO TABS: ORAL_TABLET | ORAL | 0 refills | Status: DC

## 2018-12-19 NOTE — Telephone Encounter (Signed)
Last Visit: 08/07/2018 Next Visit: 01/08/2019 Labs: 11/02/2018 stable. We will continue to monitor labs.  Okay to refill per Dr. Estanislado Pandy.

## 2018-12-19 NOTE — Telephone Encounter (Signed)
Patient's wife Carlos Patton called checking on Carlos Patton's prescription of Methotrexate.  Carlos Patton was told the prescription was sent to [facility] in [place] on 12/12/18.  Carlos Patton states the prescription needs to be sent to [facility] order pharmacy.  Carlos Patton states Carlos Patton will run out of medication next week and is requesting the prescription be sent asap.

## 2019-01-08 ENCOUNTER — Ambulatory Visit: Payer: PPO | Admitting: Rheumatology

## 2019-04-17 ENCOUNTER — Other Ambulatory Visit: Payer: Self-pay | Admitting: *Deleted

## 2019-04-17 DIAGNOSIS — Z79899 Other long term (current) drug therapy: Secondary | ICD-10-CM

## 2019-04-17 MED ORDER — METHOTREXATE 2.5 MG PO TABS: ORAL_TABLET | ORAL | 0 refills | Status: DC

## 2019-04-17 NOTE — Telephone Encounter (Signed)
Patient will be updating labs 04/18/19.

## 2019-04-17 NOTE — Telephone Encounter (Signed)
Last Visit: 08/07/18 Next Visit due April 2020. Message sent to the front to schedule Labs: 11/02/18 stable  Okay to refill 30 day supply MTX?

## 2019-04-17 NOTE — Telephone Encounter (Signed)
Please schedule office visit ASAP.  He will require labs at office visit.  Dr. Estanislado Pandy is ok with sending in 30 day supply of MTX

## 2019-04-17 NOTE — Telephone Encounter (Signed)
Please schedule patient a follow up visit. Patient was due April 2020. Patient may be interested in a virtual visit.

## 2019-04-18 DIAGNOSIS — Z79899 Other long term (current) drug therapy: Secondary | ICD-10-CM | POA: Diagnosis not present

## 2019-04-18 NOTE — Progress Notes (Signed)
Virtual Visit via Telephone Note  I connected with Carlos Patton on 04/18/19 at  1:40 PM EDT by telephone and verified that I am speaking with the correct person using two identifiers.  Location: Patient: Home  Provider: Clinic This service was conducted via virtual visit.The patient was located at home. I was located in my office.  Consent was obtained prior to the virtual visit and is aware of possible charges through their insurance for this visit.  The patient is an established patient.  Dr. Estanislado Pandy, MD conducted the virtual visit and Hazel Sams, PA-C acted as scribe during the service.  Office staff helped with scheduling follow up visits after the service was conducted.   I discussed the limitations, risks, security and privacy concerns of performing an evaluation and management service by telephone and the availability of in person appointments. I also discussed with the patient that there may be a patient responsible charge related to this service. The patient expressed understanding and agreed to proceed.  CC: Medication monitoring  History of Present Illness: Patient is a 78 year old male with a past medical history of seronegative rheumatoid arthritis.  She is taking MTX 4 tablets by mouth every week, folic acid 1 mg by mouth daily.  He denies any recent rheumatoid arthritis flares.  He denies any joint pain or joint swelling.  He has morning stiffness for about 2 minutes in the morning.    Review of Systems  Constitutional: Negative for fever and malaise/fatigue.  Eyes: Negative for photophobia, pain, discharge and redness.  Respiratory: Negative for cough, shortness of breath and wheezing.   Cardiovascular: Negative for chest pain and palpitations.  Gastrointestinal: Negative for blood in stool, constipation and diarrhea.  Genitourinary: Negative for dysuria.  Musculoskeletal: Negative for back pain, joint pain, myalgias and neck pain.       +Morning stiffness   Skin:  Negative for rash.  Neurological: Negative for dizziness and headaches.  Psychiatric/Behavioral: Negative for depression. The patient is not nervous/anxious and does not have insomnia.       Observations/Objective: Physical Exam  Constitutional: He is oriented to person, place, and time.  Neurological: He is alert and oriented to person, place, and time.  Psychiatric: Mood, memory, affect and judgment normal.   Patient reports morning stiffness for 2 minutes.   Patient denies nocturnal pain.  Difficulty dressing/grooming: Denies Difficulty climbing stairs: Denies Difficulty getting out of chair: Denies Difficulty using hands for taps, buttons, cutlery, and/or writing: Denies   Assessment and Plan: Visit Diagnoses: Rheumatoid arthritis of multiple sites with negative rheumatoid factor ([place]) - +CCP with nodulosis: He has not had any recent rheumatoid arthritis flares.  He denies any joint pain or joint swelling.  He is clinically doing well on Methotrexate 4 tablet by mouth once weekly and folic acid 1 mg po daily.  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  He has morning stiffness lasting about 2 minutes most mornings.  He has no other concerns.  He will continue taking Methotrexate 4 tablets by mouth once weekly and folic acid 1 mg po daily. He does not need any refills. He was advised to notify us if he develops increased joint pain or joint swelling.  He will follow up in 3 months.   High risk medication use - MTX 4 tablets by mouth every week, folic acid 1 mg by mouth daily.  CBC and CMP were stable on 04/18/19.  She will return for lab work in October  and every 3 months.   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  Chronic midline low back pain with right-sided sciatica:  MRI of lumbar spine on 07/23/18 revealed lumbar dextroscoliosis with advanced diffuse disc degeneration, L5 pars defect with grade 1 anterolisthesis and moderate to severe  bilateral neural foraminal stenosis.  Moderate right neural foraminal stenosis at L4-L5.  No significant spinal stenosis noted.    Other medical conditions are listed as follows:  History of hypercholesterolemia  History of coronary artery disease - Status post a stent placement   History of hypertension  History of asthma    Follow Up Instructions: He will follow up in 3 months.  He is due for lab work in October and every 3 months.   I discussed the assessment and treatment plan with the patient. The patient was provided an opportunity to ask questions and all were answered. The patient agreed with the plan and demonstrated an understanding of the instructions.   The patient was advised to call back or seek an in-person evaluation if the symptoms worsen or if the condition fails to improve as anticipated.  I provided 15 minutes of non-face-to-face time during this encounter.  Bo Merino, MD   Scribed by-  Ofilia Neas, PA-C

## 2019-04-19 ENCOUNTER — Telehealth: Payer: Self-pay | Admitting: Rheumatology

## 2019-04-19 LAB — CBC WITH DIFFERENTIAL/PLATELET
Basophils Absolute: 0 10*3/uL (ref 0.0–0.2)
Basos: 0 %
EOS (ABSOLUTE): 0.4 10*3/uL (ref 0.0–0.4)
Eos: 7 %
Hematocrit: 39 % (ref 37.5–51.0)
Hemoglobin: 13.7 g/dL (ref 13.0–17.7)
Lymphocytes Absolute: 1.2 10*3/uL (ref 0.7–3.1)
Lymphs: 20 %
MCH: 33.5 pg — ABNORMAL HIGH (ref 26.6–33.0)
MCHC: 35.1 g/dL (ref 31.5–35.7)
MCV: 95 fL (ref 79–97)
Monocytes Absolute: 0.8 10*3/uL (ref 0.1–0.9)
Monocytes: 13 %
Neutrophils Absolute: 3.6 10*3/uL (ref 1.4–7.0)
Neutrophils: 60 %
Platelets: 148 10*3/uL — ABNORMAL LOW (ref 150–450)
RBC: 4.09 x10E6/uL — ABNORMAL LOW (ref 4.14–5.80)
RDW: 12.7 % (ref 11.6–15.4)
WBC: 6 10*3/uL (ref 3.4–10.8)

## 2019-04-19 LAB — CMP14+EGFR
ALT: 11 IU/L (ref 0–44)
AST: 22 IU/L (ref 0–40)
Albumin/Globulin Ratio: 2.5 — ABNORMAL HIGH (ref 1.2–2.2)
Albumin: 4.2 g/dL (ref 3.7–4.7)
Alkaline Phosphatase: 107 IU/L (ref 39–117)
BUN/Creatinine Ratio: 18 (ref 10–24)
BUN: 18 mg/dL (ref 8–27)
Bilirubin Total: 0.5 mg/dL (ref 0.0–1.2)
CO2: 25 mmol/L (ref 20–29)
Calcium: 9.5 mg/dL (ref 8.6–10.2)
Chloride: 105 mmol/L (ref 96–106)
Creatinine, Ser: 1.01 mg/dL (ref 0.76–1.27)
GFR calc Af Amer: 82 mL/min/{1.73_m2} (ref >59)
GFR calc non Af Amer: 71 mL/min/{1.73_m2} (ref >59)
Globulin, Total: 1.7 g/dL (ref 1.5–4.5)
Glucose: 66 mg/dL (ref 65–99)
Potassium: 4.2 mmol/L (ref 3.5–5.2)
Sodium: 140 mmol/L (ref 134–144)
Total Protein: 5.9 g/dL — ABNORMAL LOW (ref 6.0–8.5)

## 2019-04-19 NOTE — Telephone Encounter (Signed)
This is patients mail order pharmacy, and they want to prescribe 90 day supply of MTX for patient. Please call to advise. FAX# [phone]

## 2019-04-19 NOTE — Telephone Encounter (Signed)
Spoke with the pharmacist and advised that we will only fill a 30 day supply at this time.

## 2019-04-19 NOTE — Telephone Encounter (Signed)
stable °

## 2019-04-26 ENCOUNTER — Telehealth (INDEPENDENT_AMBULATORY_CARE_PROVIDER_SITE_OTHER): Payer: PPO | Admitting: Rheumatology

## 2019-04-26 ENCOUNTER — Other Ambulatory Visit: Payer: Self-pay

## 2019-04-26 ENCOUNTER — Encounter: Payer: Self-pay | Admitting: Rheumatology

## 2019-04-26 DIAGNOSIS — Z79899 Other long term (current) drug therapy: Secondary | ICD-10-CM | POA: Diagnosis not present

## 2019-04-26 DIAGNOSIS — Z8679 Personal history of other diseases of the circulatory system: Secondary | ICD-10-CM | POA: Diagnosis not present

## 2019-04-26 DIAGNOSIS — M0609 Rheumatoid arthritis without rheumatoid factor, multiple sites: Secondary | ICD-10-CM | POA: Diagnosis not present

## 2019-04-26 DIAGNOSIS — Z8639 Personal history of other endocrine, nutritional and metabolic disease: Secondary | ICD-10-CM

## 2019-04-26 DIAGNOSIS — M0630 Rheumatoid nodule, unspecified site: Secondary | ICD-10-CM

## 2019-04-26 DIAGNOSIS — Z8709 Personal history of other diseases of the respiratory system: Secondary | ICD-10-CM

## 2019-04-30 ENCOUNTER — Telehealth: Payer: Self-pay | Admitting: Rheumatology

## 2019-04-30 MED ORDER — METHOTREXATE 2.5 MG PO TABS: ORAL_TABLET | ORAL | 0 refills | Status: DC

## 2019-04-30 NOTE — Telephone Encounter (Signed)
Patient's wife states the [facility] mail order did not receive the prescription that was sent in on 04/17/19. I advised her that I would resend in the prescription and would send it in for a 90 day prescription as labs have been updated.   Last Visit: 04/26/19 Next Visit: 07/18/19 Labs: 04/18/19 stable  Okay to refill per Dr. Estanislado Pandy

## 2019-04-30 NOTE — Telephone Encounter (Signed)
-----   Message from [place] sent at 04/26/2019  2:31 PM EDT ----- Patient had a virtual visit today with Dr. Estanislado Pandy. Please call to schedule follow up appointment (end of October). Thanks!

## 2019-04-30 NOTE — Telephone Encounter (Signed)
Carlos Patton called requesting a return call regarding Aristide's prescription of Methotrexate.

## 2019-04-30 NOTE — Telephone Encounter (Signed)
LMOM for patient to call and schedule follow-up appointment (end of October).

## 2019-05-02 ENCOUNTER — Telehealth: Payer: Self-pay | Admitting: Rheumatology

## 2019-05-02 MED ORDER — METHOTREXATE 2.5 MG PO TABS: ORAL_TABLET | ORAL | 0 refills | Status: DC

## 2019-05-02 NOTE — Telephone Encounter (Signed)
Spoke with Hope and verified that the patient is taking 4 tabs weekly.

## 2019-05-02 NOTE — Telephone Encounter (Signed)
Hope from [facility] called requesting a return call to verify instructions on patient's Methotrexate. Hope states the directions are "take 4 tablets (12.5 mg total) which equals 5 tablets and how patient is taking it."   Please call back at [phone]

## 2019-07-04 NOTE — Progress Notes (Deleted)
Office Visit Note  Patient: Carlos Patton             Date of Birth: 12-12-1940           MRN: [id number]             PCP: Velna Hatchet, MD Referring: Velna Hatchet, MD Visit Date: 07/18/2019 Occupation: @GUAROCC @  Subjective:  No chief complaint on file.  Methotrexate 2.5 mg 4 tablets every 7 days and folic acid 1 mg 2 tablets daily.  Last CBC/CMP within normal limits except for low platelets but stable on 04/18/2019.  Due for CBC/CMP today and will monitor every 3 months.  History of Present Illness: KRISTIFER SCHOSSOW is a 78 y.o. male ***   Activities of Daily Living:  Patient reports morning stiffness for *** {minute/hour:19697}.   Patient {ACTIONS;DENIES/REPORTS:21021675::"Denies"} nocturnal pain.  Difficulty dressing/grooming: {ACTIONS;DENIES/REPORTS:21021675::"Denies"} Difficulty climbing stairs: {ACTIONS;DENIES/REPORTS:21021675::"Denies"} Difficulty getting out of chair: {ACTIONS;DENIES/REPORTS:21021675::"Denies"} Difficulty using hands for taps, buttons, cutlery, and/or writing: {ACTIONS;DENIES/REPORTS:21021675::"Denies"}  No Rheumatology ROS completed.   PMFS History:  Patient Active Problem List   Diagnosis Date Noted  . Sepsis ([place]) 09/12/2018  . CAP (community acquired pneumonia) 09/12/2018  . Hypertension   . Rheumatoid arthritis of multiple sites with negative rheumatoid factor ([place]) 03/07/2018  . Rheumatoid nodules ([place]) 03/07/2018  . High risk medication use 03/07/2018  . History of asthma 03/07/2018  . History of hypertension 03/07/2018  . History of hypercholesterolemia 03/07/2018  . History of coronary artery disease 03/07/2018  . Coronary atherosclerosis 04/27/2010  . NONSPECIFIC ABNORMAL FINDING IN STOOL CONTENTS 04/27/2010  . PERSONAL HISTORY OF COLONIC POLYPS 04/27/2010    Past Medical History:  Diagnosis Date  . Allergy   . Arthritis   . Asthma    oc flare in the past   . CAP (community acquired pneumonia)   . Cataract   . Heart  murmur   . History of colon polyps   . Hyperlipidemia   . Hypertension   . Myocardial infarction ([place])    2 MI--1980's, 1999  . Sepsis ([place])     Family History  Problem Relation Age of Onset  . Colon cancer Sister   . Arthritis Mother   . Stroke Father   . Heart attack Father   . Heart Problems Brother   . Heart Problems Brother   . Gout Son   . Colon polyps Neg Hx   . Esophageal cancer Neg Hx   . Rectal cancer Neg Hx   . Stomach cancer Neg Hx    Past Surgical History:  Procedure Laterality Date  . CARDIAC CATHETERIZATION    . CATARACT EXTRACTION Bilateral   . COLONOSCOPY    . CORONARY STENT PLACEMENT    . INGUINAL HERNIA REPAIR Bilateral   . POLYPECTOMY     Social History   Social History Narrative  . Not on file    There is no immunization history on file for this patient.   Objective: Vital Signs: There were no vitals taken for this visit.   Physical Exam   Musculoskeletal Exam: ***  CDAI Exam: CDAI Score: - Patient Global: -; Provider Global: - Swollen: -; Tender: - Joint Exam   No joint exam has been documented for this visit   There is currently no information documented on the homunculus. Go to the Rheumatology activity and complete the homunculus joint exam.  Investigation: No additional findings.  Imaging: No results found.  Recent Labs: Lab Results  Component Value Date  WBC 6.0 04/18/2019   HGB 13.7 04/18/2019   PLT 148 (L) 04/18/2019   NA 140 04/18/2019   K 4.2 04/18/2019   CL 105 04/18/2019   CO2 25 04/18/2019   GLUCOSE 66 04/18/2019   BUN 18 04/18/2019   CREATININE 1.01 04/18/2019   BILITOT 0.5 04/18/2019   ALKPHOS 107 04/18/2019   AST 22 04/18/2019   ALT 11 04/18/2019   PROT 5.9 (L) 04/18/2019   ALBUMIN 4.2 04/18/2019   CALCIUM 9.5 04/18/2019   GFRAA 82 04/18/2019    Speciality Comments: No specialty comments available.  Procedures:  No procedures performed Allergies: Patient has no known allergies.    Assessment / Plan:     Visit Diagnoses: No diagnosis found.  Orders: No orders of the defined types were placed in this encounter.  No orders of the defined types were placed in this encounter.   Face-to-face time spent with patient was *** minutes. Greater than 50% of time was spent in counseling and coordination of care.  Follow-Up Instructions: No follow-ups on file.   Earnestine Mealing, CMA  Note - This record has been created using Editor, commissioning.  Chart creation errors have been sought, but may not always  have been located. Such creation errors do not reflect on  the standard of medical care.

## 2019-07-11 DIAGNOSIS — D3132 Benign neoplasm of left choroid: Secondary | ICD-10-CM | POA: Diagnosis not present

## 2019-07-11 DIAGNOSIS — H26491 Other secondary cataract, right eye: Secondary | ICD-10-CM | POA: Diagnosis not present

## 2019-07-11 DIAGNOSIS — H353132 Nonexudative age-related macular degeneration, bilateral, intermediate dry stage: Secondary | ICD-10-CM | POA: Diagnosis not present

## 2019-07-11 DIAGNOSIS — H04123 Dry eye syndrome of bilateral lacrimal glands: Secondary | ICD-10-CM | POA: Diagnosis not present

## 2019-07-18 ENCOUNTER — Ambulatory Visit: Payer: PPO | Admitting: Rheumatology

## 2019-07-25 NOTE — Progress Notes (Signed)
Office Visit Note  Patient: Carlos Patton             Date of Birth: June 21, 1941           MRN: [id number]             PCP: Velna Hatchet, MD Referring: Velna Hatchet, MD Visit Date: 08/08/2019 Occupation: @GUAROCC @  Subjective:  Stiffness in both hands   History of Present Illness: Carlos Patton is a 78 y.o. male with history of seronegative rheumatoid arthritis.  He is taking methotrexate 4 tablets by mouth once weekly and folic acid 1 mg by mouth daily.  He denies any recent rheumatoid arthritis flares.  He denies any joint pain or joint swelling.  He experiences stiffness in both hands for about 5 minutes in the morning.  He continues to have rheumatoid nodules present on the left hand.     Activities of Daily Living:  Patient reports morning stiffness for 5 minutes.   Patient Denies nocturnal pain.  Difficulty dressing/grooming: Denies Difficulty climbing stairs: Denies Difficulty getting out of chair: Denies Difficulty using hands for taps, buttons, cutlery, and/or writing: Denies  Review of Systems  Constitutional: Negative for fatigue and night sweats.  HENT: Negative for mouth sores, mouth dryness and nose dryness.   Eyes: Negative for redness, itching and dryness.  Respiratory: Negative for cough, hemoptysis, shortness of breath, wheezing and difficulty breathing.   Cardiovascular: Negative for chest pain, palpitations, hypertension, irregular heartbeat and swelling in legs/feet.  Gastrointestinal: Negative for abdominal pain, blood in stool, constipation and diarrhea.  Endocrine: Negative for increased urination.  Genitourinary: Negative for difficulty urinating and painful urination.  Musculoskeletal: Positive for arthralgias, joint pain and morning stiffness. Negative for joint swelling, myalgias, muscle weakness, muscle tenderness and myalgias.  Skin: Negative for color change, rash, hair loss, nodules/bumps, skin tightness, ulcers and sensitivity to  sunlight.  Allergic/Immunologic: Negative for susceptible to infections.  Neurological: Negative for dizziness, fainting, light-headedness, numbness, headaches, memory loss, night sweats and weakness.  Hematological: Negative for swollen glands.  Psychiatric/Behavioral: Negative for depressed mood, confusion and sleep disturbance. The patient is not nervous/anxious.     PMFS History:  Patient Active Problem List   Diagnosis Date Noted  . Sepsis ([place]) 09/12/2018  . CAP (community acquired pneumonia) 09/12/2018  . Hypertension   . Rheumatoid arthritis of multiple sites with negative rheumatoid factor ([place]) 03/07/2018  . Rheumatoid nodules ([place]) 03/07/2018  . High risk medication use 03/07/2018  . History of asthma 03/07/2018  . History of hypertension 03/07/2018  . History of hypercholesterolemia 03/07/2018  . History of coronary artery disease 03/07/2018  . Coronary atherosclerosis 04/27/2010  . NONSPECIFIC ABNORMAL FINDING IN STOOL CONTENTS 04/27/2010  . PERSONAL HISTORY OF COLONIC POLYPS 04/27/2010    Past Medical History:  Diagnosis Date  . Allergy   . Arthritis   . Asthma    oc flare in the past   . CAP (community acquired pneumonia)   . Cataract   . Heart murmur   . History of colon polyps   . Hyperlipidemia   . Hypertension   . Myocardial infarction ([place])    2 MI--1980's, 1999  . Sepsis ([place])     Family History  Problem Relation Age of Onset  . Colon cancer Sister   . Arthritis Mother   . Stroke Father   . Heart attack Father   . Heart Problems Brother   . Heart Problems Brother   . Gout Son   .  Colon polyps Neg Hx   . Esophageal cancer Neg Hx   . Rectal cancer Neg Hx   . Stomach cancer Neg Hx    Past Surgical History:  Procedure Laterality Date  . CARDIAC CATHETERIZATION    . CATARACT EXTRACTION Bilateral   . COLONOSCOPY    . CORONARY STENT PLACEMENT    . INGUINAL HERNIA REPAIR Bilateral   . POLYPECTOMY     Social History   Social History  Narrative  . Not on file    There is no immunization history on file for this patient.   Objective: Vital Signs: BP (!) 152/86 (BP Location: Left Arm, Patient Position: Sitting, Cuff Size: Normal)   Pulse (!) 55   Resp 13   Ht 6\' 2"  (1.88 m)   Wt 216 lb 3.2 oz (98.1 kg)   BMI 27.76 kg/m    Physical Exam Vitals signs and nursing note reviewed.  Constitutional:      Appearance: He is well-developed.  HENT:     Head: Normocephalic and atraumatic.  Eyes:     Conjunctiva/sclera: Conjunctivae normal.     Pupils: Pupils are equal, round, and reactive to light.  Neck:     Musculoskeletal: Normal range of motion and neck supple.  Cardiovascular:     Rate and Rhythm: Normal rate and regular rhythm.     Heart sounds: Normal heart sounds.  Pulmonary:     Effort: Pulmonary effort is normal.     Breath sounds: Normal breath sounds.  Abdominal:     General: Bowel sounds are normal.     Palpations: Abdomen is soft.  Skin:    General: Skin is warm and dry.     Capillary Refill: Capillary refill takes less than 2 seconds.  Neurological:     Mental Status: He is alert and oriented to person, place, and time.  Psychiatric:        Behavior: Behavior normal.      Musculoskeletal Exam: C-spine good range of motion.  Thoracic kyphosis noted.  Shoulder joints, elbow joints, wrist joints, MCPs, PIPs, DIPs good range of motion no synovitis.  He has PIP and DIP synovial thickening consistent with osteoarthritis of both hands.  Rheumatoid nodulosis noted on the left hand.  Hip joints, knee joints, ankle joints good range of motion no synovitis.  No warmth or effusion of bilateral knee joints.  No tenderness or swelling of ankle joints.   CDAI Exam: CDAI Score: - Patient Global: -; Provider Global: - Swollen: -; Tender: - Joint Exam   No joint exam has been documented for this visit   There is currently no information documented on the homunculus. Go to the Rheumatology activity and complete  the homunculus joint exam.  Investigation: No additional findings.  Imaging: No results found.  Recent Labs: Lab Results  Component Value Date   WBC 6.0 04/18/2019   HGB 13.7 04/18/2019   PLT 148 (L) 04/18/2019   NA 140 04/18/2019   K 4.2 04/18/2019   CL 105 04/18/2019   CO2 25 04/18/2019   GLUCOSE 66 04/18/2019   BUN 18 04/18/2019   CREATININE 1.01 04/18/2019   BILITOT 0.5 04/18/2019   ALKPHOS 107 04/18/2019   AST 22 04/18/2019   ALT 11 04/18/2019   PROT 5.9 (L) 04/18/2019   ALBUMIN 4.2 04/18/2019   CALCIUM 9.5 04/18/2019   GFRAA 82 04/18/2019    Speciality Comments: No specialty comments available.  Procedures:  No procedures performed Allergies: Patient has no known allergies.  Assessment / Plan:     Visit Diagnoses: Rheumatoid arthritis of multiple sites with negative rheumatoid factor ([facility]) - +CCP with nodulosis: He has no synovitis on exam.  He has not had any recent rheumatoid arthritis flares.  He is clinically doing well on methotrexate 4 tablets by mouth once weekly and folic acid 1 mg by mouth daily.  He has not missed any doses of methotrexate recently.  He has no joint pain or joint swelling at this time.  He experiences morning stiffness lasting about 5 minutes.  He continues to have nodulosis on the left hand.  He will continue taking methotrexate and folic acid as prescribed.  A refill methotrexate was sent to the pharmacy today.  We will update lab work today in the office.  He was advised to notify us if he develops increased joint pain or joint swelling.  He will follow-up in the office in 5 months.  High risk medication use -  MTX 4 tablets by mouth every week, folic acid 1 mg by mouth daily.  CBC and CMP were drawn today to monitor for drug toxicity.  He will return in February and every 3 months for lab work.- Plan: CBC, CMP  Rheumatoid nodules ([place]): On left hand.   Other medical conditions are listed as follows:   History of hypercholesterolemia   History of coronary artery disease  History of hypertension  History of asthma  Orders: Orders Placed This Encounter  Procedures  . CBC  . CMP   Meds ordered this encounter  Medications  . methotrexate (RHEUMATREX) 2.5 MG tablet    Sig: Take 4 tablets (10 mg total) by mouth once a week (Caution: chemotherapy. Protect from light)    Dispense:  48 tablet    Refill:  0      Follow-Up Instructions: Return in about 5 months (around 01/06/2020) for Rheumatoid arthritis.   Ofilia Neas, PA-C   I examined and evaluated the patient with Hazel Sams PA.  Patient's rheumatoid arthritis is very well controlled on methotrexate.  He had no active synovitis on examination.  The plan of care was discussed as noted above.  Bo Merino, MD  Note - This record has been created using Editor, commissioning.  Chart creation errors have been sought, but may not always  have been located. Such creation errors do not reflect on  the standard of medical care.

## 2019-07-29 DIAGNOSIS — E7849 Other hyperlipidemia: Secondary | ICD-10-CM | POA: Diagnosis not present

## 2019-07-29 DIAGNOSIS — E559 Vitamin D deficiency, unspecified: Secondary | ICD-10-CM | POA: Diagnosis not present

## 2019-08-05 DIAGNOSIS — Z86010 Personal history of colonic polyps: Secondary | ICD-10-CM | POA: Diagnosis not present

## 2019-08-05 DIAGNOSIS — I25118 Atherosclerotic heart disease of native coronary artery with other forms of angina pectoris: Secondary | ICD-10-CM | POA: Diagnosis not present

## 2019-08-05 DIAGNOSIS — Z79899 Other long term (current) drug therapy: Secondary | ICD-10-CM | POA: Diagnosis not present

## 2019-08-05 DIAGNOSIS — J45909 Unspecified asthma, uncomplicated: Secondary | ICD-10-CM | POA: Diagnosis not present

## 2019-08-05 DIAGNOSIS — E785 Hyperlipidemia, unspecified: Secondary | ICD-10-CM | POA: Diagnosis not present

## 2019-08-05 DIAGNOSIS — R972 Elevated prostate specific antigen [PSA]: Secondary | ICD-10-CM | POA: Diagnosis not present

## 2019-08-05 DIAGNOSIS — M069 Rheumatoid arthritis, unspecified: Secondary | ICD-10-CM | POA: Diagnosis not present

## 2019-08-05 DIAGNOSIS — H269 Unspecified cataract: Secondary | ICD-10-CM | POA: Diagnosis not present

## 2019-08-05 DIAGNOSIS — Z0000 Encounter for general adult medical examination without abnormal findings: Secondary | ICD-10-CM | POA: Diagnosis not present

## 2019-08-05 DIAGNOSIS — M8580 Other specified disorders of bone density and structure, unspecified site: Secondary | ICD-10-CM | POA: Diagnosis not present

## 2019-08-05 DIAGNOSIS — I10 Essential (primary) hypertension: Secondary | ICD-10-CM | POA: Diagnosis not present

## 2019-08-05 DIAGNOSIS — E559 Vitamin D deficiency, unspecified: Secondary | ICD-10-CM | POA: Diagnosis not present

## 2019-08-05 DIAGNOSIS — Z1331 Encounter for screening for depression: Secondary | ICD-10-CM | POA: Diagnosis not present

## 2019-08-08 ENCOUNTER — Encounter: Payer: Self-pay | Admitting: Rheumatology

## 2019-08-08 ENCOUNTER — Ambulatory Visit: Payer: PPO | Admitting: Rheumatology

## 2019-08-08 ENCOUNTER — Other Ambulatory Visit: Payer: Self-pay

## 2019-08-08 VITALS — BP 152/86 | HR 55 | Resp 13 | Ht 74.0 in | Wt 216.2 lb

## 2019-08-08 DIAGNOSIS — Z8709 Personal history of other diseases of the respiratory system: Secondary | ICD-10-CM

## 2019-08-08 DIAGNOSIS — M0630 Rheumatoid nodule, unspecified site: Secondary | ICD-10-CM

## 2019-08-08 DIAGNOSIS — I10 Essential (primary) hypertension: Secondary | ICD-10-CM | POA: Diagnosis not present

## 2019-08-08 DIAGNOSIS — M0609 Rheumatoid arthritis without rheumatoid factor, multiple sites: Secondary | ICD-10-CM

## 2019-08-08 DIAGNOSIS — Z8679 Personal history of other diseases of the circulatory system: Secondary | ICD-10-CM

## 2019-08-08 DIAGNOSIS — Z79899 Other long term (current) drug therapy: Secondary | ICD-10-CM

## 2019-08-08 DIAGNOSIS — Z8639 Personal history of other endocrine, nutritional and metabolic disease: Secondary | ICD-10-CM | POA: Diagnosis not present

## 2019-08-08 DIAGNOSIS — R82998 Other abnormal findings in urine: Secondary | ICD-10-CM | POA: Diagnosis not present

## 2019-08-08 DIAGNOSIS — Z23 Encounter for immunization: Secondary | ICD-10-CM | POA: Diagnosis not present

## 2019-08-08 MED ORDER — METHOTREXATE 2.5 MG PO TABS: ORAL_TABLET | ORAL | 0 refills | Status: DC

## 2019-08-09 LAB — COMPLETE METABOLIC PANEL WITH GFR
AG Ratio: 2.2 (calc) (ref 1.0–2.5)
ALT: 13 U/L (ref 9–46)
AST: 30 U/L (ref 10–35)
Albumin: 4.3 g/dL (ref 3.6–5.1)
Alkaline phosphatase (APISO): 81 U/L (ref 35–144)
BUN: 15 mg/dL (ref 7–25)
CO2: 26 mmol/L (ref 20–32)
Calcium: 9.7 mg/dL (ref 8.6–10.3)
Chloride: 105 mmol/L (ref 98–110)
Creat: 1.08 mg/dL (ref 0.70–1.18)
GFR, Est African American: 75 mL/min/{1.73_m2} (ref >=60)
GFR, Est Non African American: 65 mL/min/{1.73_m2} (ref >=60)
Globulin: 2 g/dL (calc) (ref 1.9–3.7)
Glucose, Bld: 74 mg/dL (ref 65–99)
Potassium: 4.3 mmol/L (ref 3.5–5.3)
Sodium: 140 mmol/L (ref 135–146)
Total Bilirubin: 1 mg/dL (ref 0.2–1.2)
Total Protein: 6.3 g/dL (ref 6.1–8.1)

## 2019-08-09 LAB — CBC WITH DIFFERENTIAL/PLATELET
Absolute Monocytes: 580 cells/uL (ref 200–950)
Basophils Absolute: 31 cells/uL (ref 0–200)
Basophils Relative: 0.5 %
Eosinophils Absolute: 201 cells/uL (ref 15–500)
Eosinophils Relative: 3.3 %
HCT: 41.7 % (ref 38.5–50.0)
Hemoglobin: 14 g/dL (ref 13.2–17.1)
Lymphs Abs: 1074 cells/uL (ref 850–3900)
MCH: 33.1 pg — ABNORMAL HIGH (ref 27.0–33.0)
MCHC: 33.6 g/dL (ref 32.0–36.0)
MCV: 98.6 fL (ref 80.0–100.0)
MPV: 12 fL (ref 7.5–12.5)
Monocytes Relative: 9.5 %
Neutro Abs: 4215 cells/uL (ref 1500–7800)
Neutrophils Relative %: 69.1 %
Platelets: 135 10*3/uL — ABNORMAL LOW (ref 140–400)
RBC: 4.23 10*6/uL (ref 4.20–5.80)
RDW: 13.3 % (ref 11.0–15.0)
Total Lymphocyte: 17.6 %
WBC: 6.1 10*3/uL (ref 3.8–10.8)

## 2019-08-09 NOTE — Progress Notes (Signed)
CBC stable. CMP WNL.

## 2019-08-23 ENCOUNTER — Telehealth: Payer: Self-pay

## 2019-08-23 NOTE — Telephone Encounter (Signed)
Lab results received via fax from [facility].   07/29/2019 MCV 98.7, MCH 32.2, cholesterol 209, apolipoprotein 98.   Reviewed by Dr. Estanislado Pandy, will send to scan center.    Attempted to contact patient and left message on machine to advise patient lab results have been received.

## 2019-08-26 DIAGNOSIS — R972 Elevated prostate specific antigen [PSA]: Secondary | ICD-10-CM | POA: Diagnosis not present

## 2019-08-26 DIAGNOSIS — N138 Other obstructive and reflux uropathy: Secondary | ICD-10-CM | POA: Diagnosis not present

## 2019-08-26 DIAGNOSIS — N401 Enlarged prostate with lower urinary tract symptoms: Secondary | ICD-10-CM | POA: Diagnosis not present

## 2019-10-18 DIAGNOSIS — Z1212 Encounter for screening for malignant neoplasm of rectum: Secondary | ICD-10-CM | POA: Diagnosis not present

## 2019-11-17 IMAGING — MR MR LUMBAR SPINE W/O CM
4 of 5 series · 26 of 48 positions shown · non-contrast
Comparison: Lumbar spine radiographs 06/22/2018

CLINICAL DATA: Chronic low back pain with right hip and leg pain
and weakness for 6 months.

EXAM:
MRI LUMBAR SPINE WITHOUT CONTRAST
TECHNIQUE: Multiplanar, multisequence MR imaging of the lumbar spine was
performed. No intravenous contrast was administered.

[Series 4: T2 post-contrast · sagittal · 4.0mm · 0.55mm/px · 8 of 17 slices shown]
[im 1/17]
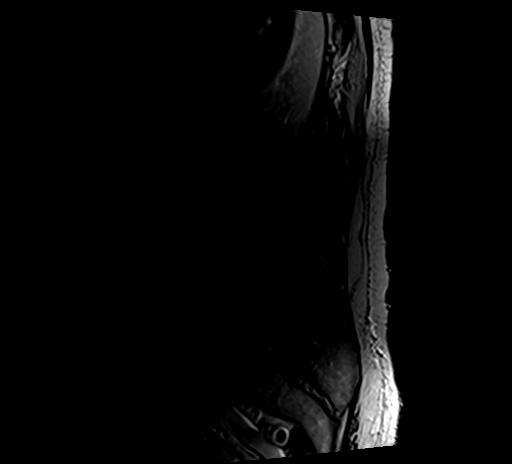
[im 3/17]
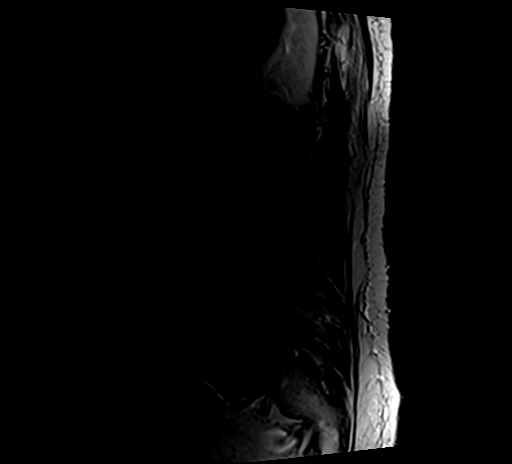
[im 5/17]
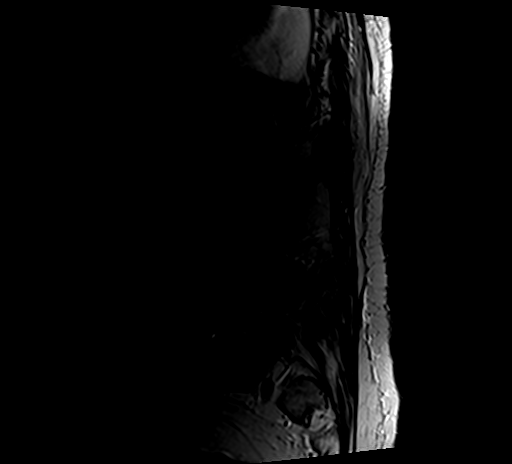
[im 7/17]
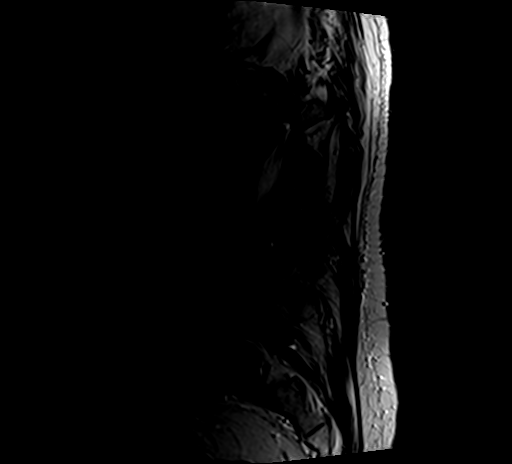
[im 10/17]
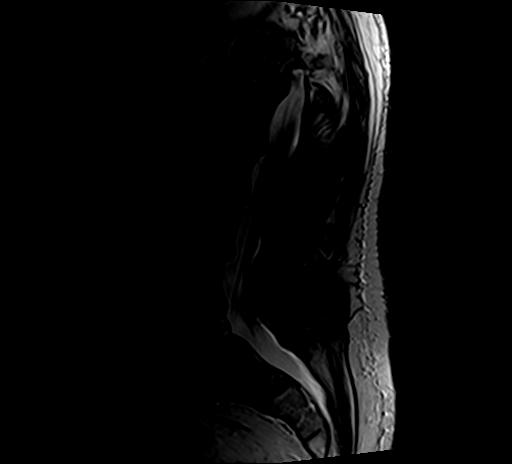
[im 12/17]
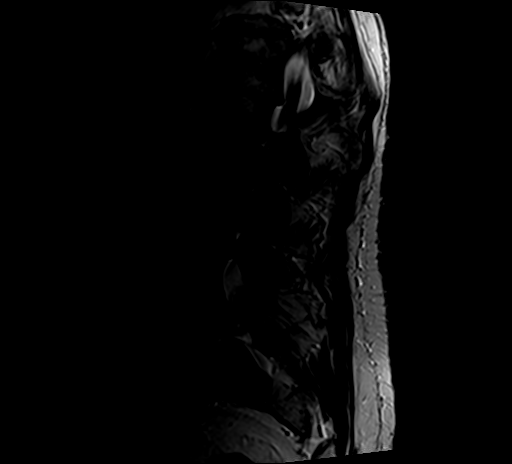
[im 14/17]
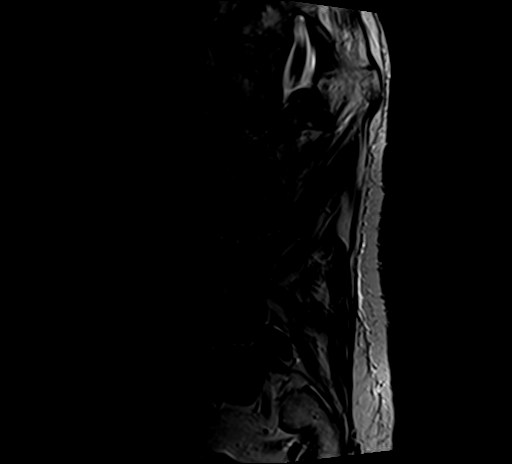
[im 17/17]
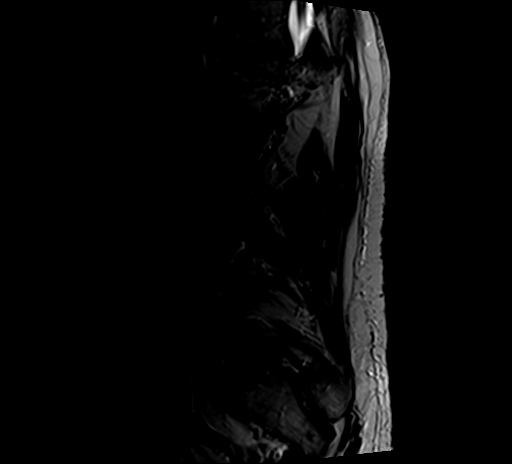

[Series 7: T1 · axial · 4.0mm · 0.35mm/px · z∈[-52,+91]mm · 6 of 31 slices shown (1 of 2)]
[im 1/31]
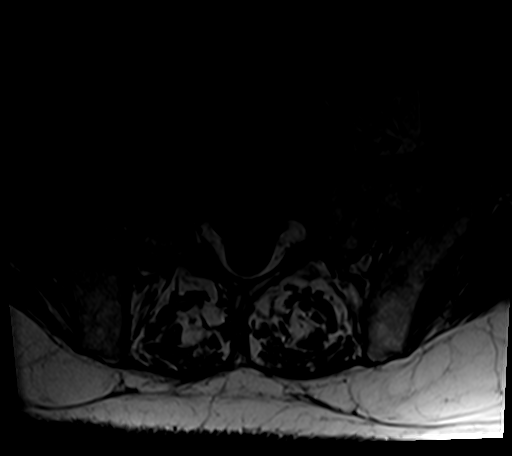
[im 6/31]
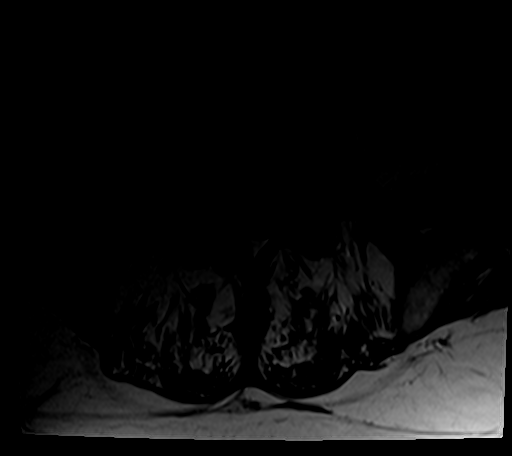
[im 11/31]
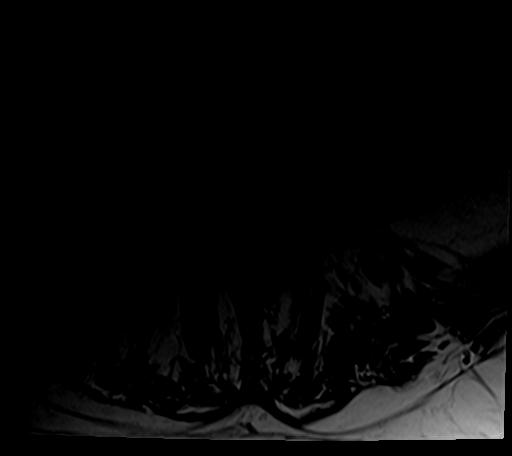
[im 13/31]
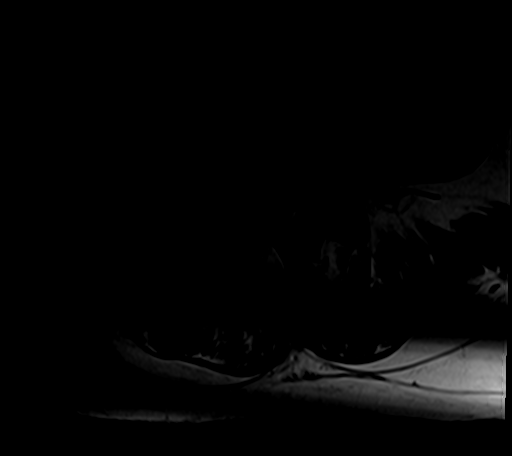
[im 16/31]
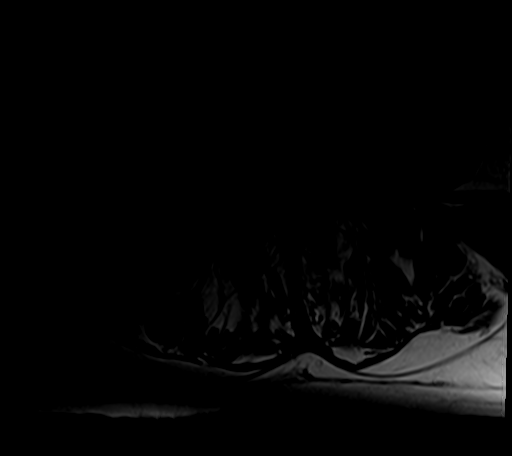
[im 26/31]
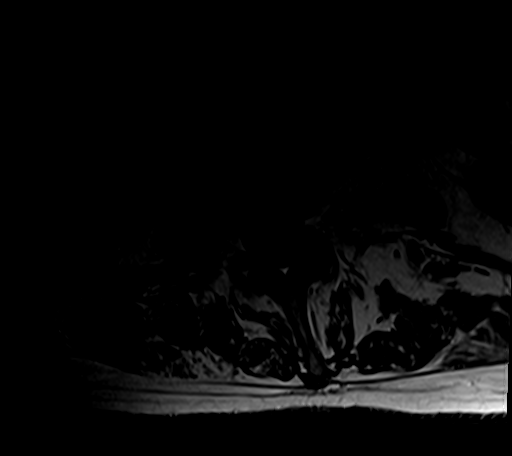

[Series 8: T1 · sagittal · 4.0mm · 0.55mm/px · 3 of 17 slices shown (2 of 2)]
[im 3/17]
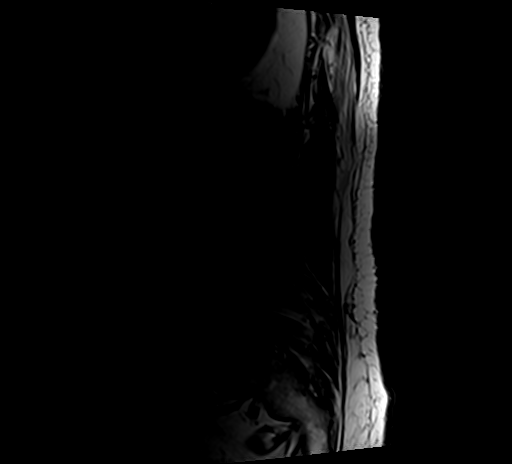
[im 9/17]
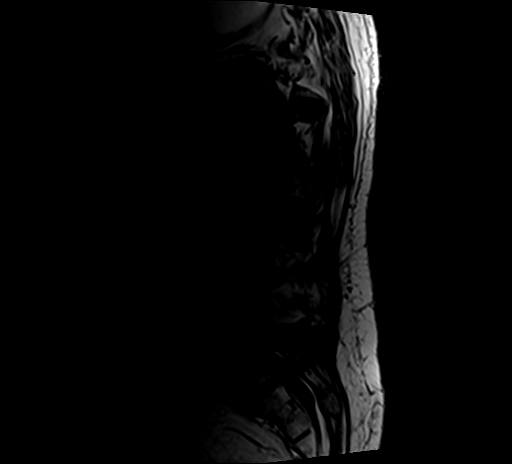
[im 14/17]
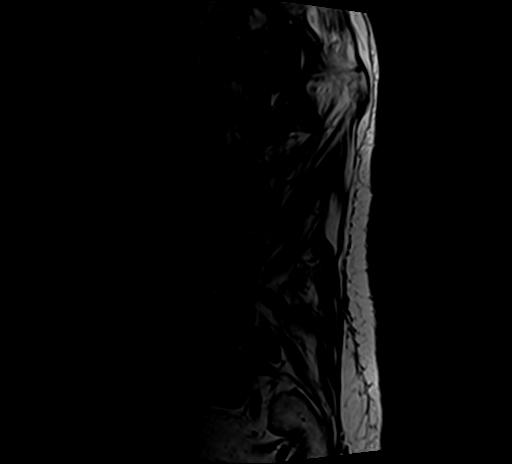

[Series 9: T2 · axial · 4.0mm · 0.70mm/px · z∈[-52,+165]mm · 9 of 31 slices shown]
[im 1/31]
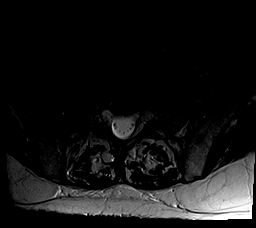
[im 6/31]
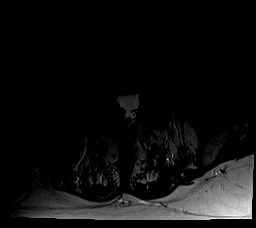
[im 11/31]
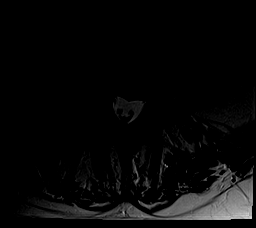
[im 13/31]
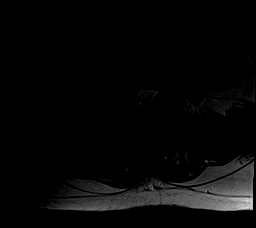
[im 16/31]
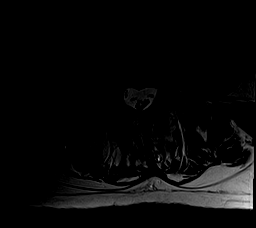
[im 18/31]
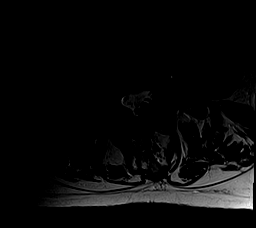
[im 21/31]
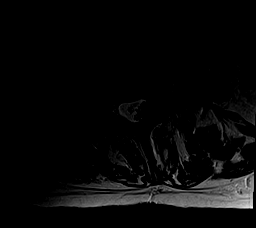
[im 26/31]
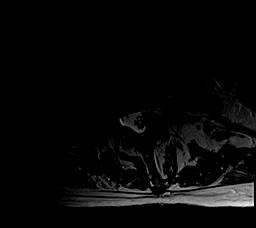
[im 31/31]
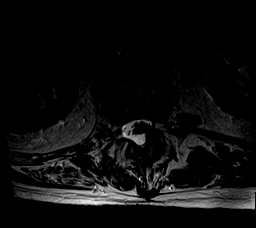

[26 of 48 positions shown; findings below may reference images not displayed]

FINDINGS: Segmentation: The lowest fully formed intervertebral disc space is
designated L5-S1. T12 ribs are small or absent.

Alignment: Moderately severe lumbar dextroscoliosis with apex at L2.
Bilateral L5 pars defects with 8 mm anterolisthesis of L5 on S1.

Vertebrae: No fracture or suspicious osseous lesion. Mild
degenerative endplate edema at L3-4 with mild-to-moderate
degenerative edema at L5-S1. Prominent type 2 degenerative endplate
changes at T12-L1 and L2-3.

Conus medullaris and cauda equina: Conus extends to the L1 level.
Conus and cauda equina appear normal.

Paraspinal and other soft tissues: Unremarkable.

Disc levels:

Disc desiccation and severe disc space narrowing throughout the
lumbar spine. Degenerative interbody ankylosis at T12-L1.

T12-L1: Mild diffuse osteophytic ridging and mild facet hypertrophy
result in mild left lateral recess stenosis without spinal or neural
foraminal stenosis.

L1-2: Mild osteophytic ridging and mild facet hypertrophy result in
borderline left lateral recess stenosis without spinal or neural
foraminal stenosis.

L2-3: Foraminal endplate osteophytes and mild facet hypertrophy
result in mild left neural foraminal stenosis without spinal
stenosis.

L3-4: Circumferential disc osteophyte complex and mild facet and
ligamentum flavum hypertrophy result in mild bilateral neural
foraminal stenosis without spinal stenosis.

L4-5: Circumferential disc osteophyte complex greater to the right
and mild facet hypertrophy result in moderate right neural foraminal
stenosis without spinal stenosis.

L5-S1: Anterolisthesis with disc uncovering results in moderate to
severe bilateral neural foraminal stenosis without spinal stenosis.
IMPRESSION: 1. Lumbar dextroscoliosis with advanced diffuse disc degeneration.
2. L5 pars defects with grade 1 anterolisthesis and moderate to
severe bilateral neural foraminal stenosis.
3. Moderate right neural foraminal stenosis at L4-5.
4. No significant spinal stenosis.

## 2019-12-02 ENCOUNTER — Telehealth: Payer: Self-pay | Admitting: Rheumatology

## 2019-12-02 ENCOUNTER — Other Ambulatory Visit: Payer: Self-pay | Admitting: *Deleted

## 2019-12-02 DIAGNOSIS — Z79899 Other long term (current) drug therapy: Secondary | ICD-10-CM | POA: Diagnosis not present

## 2019-12-02 MED ORDER — METHOTREXATE 2.5 MG PO TABS: ORAL_TABLET | ORAL | 0 refills | Status: DC

## 2019-12-02 NOTE — Telephone Encounter (Signed)
Last Visit: 08/08/19 Next Visit: 01/09/20 Labs: 08/08/19 CBC stable. CMP WNL  Patient in office today updating labs.   Current dose per office note on 08/08/19: methotrexate 4 tablets by mouth once weekly   Okay to refill per Dr. Estanislado Pandy

## 2019-12-02 NOTE — Telephone Encounter (Signed)
Patient is at the office to have labwork and also requesting prescription refill of Methotrexate.

## 2019-12-03 LAB — COMPLETE METABOLIC PANEL WITH GFR
AG Ratio: 1.8 (calc) (ref 1.0–2.5)
ALT: 15 U/L (ref 9–46)
AST: 30 U/L (ref 10–35)
Albumin: 3.8 g/dL (ref 3.6–5.1)
Alkaline phosphatase (APISO): 81 U/L (ref 35–144)
BUN: 19 mg/dL (ref 7–25)
CO2: 30 mmol/L (ref 20–32)
Calcium: 9.2 mg/dL (ref 8.6–10.3)
Chloride: 107 mmol/L (ref 98–110)
Creat: 1.13 mg/dL (ref 0.70–1.18)
GFR, Est African American: 71 mL/min/{1.73_m2} (ref >=60)
GFR, Est Non African American: 61 mL/min/{1.73_m2} (ref >=60)
Globulin: 2.1 g/dL (calc) (ref 1.9–3.7)
Glucose, Bld: 88 mg/dL (ref 65–99)
Potassium: 4.9 mmol/L (ref 3.5–5.3)
Sodium: 142 mmol/L (ref 135–146)
Total Bilirubin: 0.5 mg/dL (ref 0.2–1.2)
Total Protein: 5.9 g/dL — ABNORMAL LOW (ref 6.1–8.1)

## 2019-12-03 LAB — CBC WITH DIFFERENTIAL/PLATELET
Absolute Monocytes: 559 cells/uL (ref 200–950)
Basophils Absolute: 19 cells/uL (ref 0–200)
Basophils Relative: 0.4 %
Eosinophils Absolute: 329 cells/uL (ref 15–500)
Eosinophils Relative: 7 %
HCT: 39.7 % (ref 38.5–50.0)
Hemoglobin: 13.1 g/dL — ABNORMAL LOW (ref 13.2–17.1)
Lymphs Abs: 841 cells/uL — ABNORMAL LOW (ref 850–3900)
MCH: 33 pg (ref 27.0–33.0)
MCHC: 33 g/dL (ref 32.0–36.0)
MCV: 100 fL (ref 80.0–100.0)
MPV: 11.8 fL (ref 7.5–12.5)
Monocytes Relative: 11.9 %
Neutro Abs: 2952 cells/uL (ref 1500–7800)
Neutrophils Relative %: 62.8 %
Platelets: 136 10*3/uL — ABNORMAL LOW (ref 140–400)
RBC: 3.97 10*6/uL — ABNORMAL LOW (ref 4.20–5.80)
RDW: 12.8 % (ref 11.0–15.0)
Total Lymphocyte: 17.9 %
WBC: 4.7 10*3/uL (ref 3.8–10.8)

## 2019-12-03 NOTE — Progress Notes (Signed)
Labs are stable.

## 2019-12-31 NOTE — Progress Notes (Signed)
Office Visit Note  Patient: Carlos Patton             Date of Birth: [birth year]           MRN: [id number]             PCP: Velna Hatchet, MD Referring: Velna Hatchet, MD Visit Date: 01/09/2020 Occupation: @GUAROCC @  Subjective:  Medication monitoring   History of Present Illness: Carlos Patton is a 79 y.o. male with history of seronegative rheumatoid arthritis.  Patient is taking methotrexate 4 tablets by mouth once weekly and folic acid 1 mg by mouth daily.  He has not missed any doses of methotrexate recently.  He denies any recent rheumatoid arthritis flares.  He states that he is occasional arthralgias but denies any joint inflammation.  He states that methotrexate continues to be effective.  He denies any new concerns.  He denies any recent infections.  He received both COVID-19 vaccinations.  Activities of Daily Living:  Patient reports morning stiffness for 1 minute.   Patient Denies nocturnal pain.  Difficulty dressing/grooming: Denies Difficulty climbing stairs: Denies Difficulty getting out of chair: Denies Difficulty using hands for taps, buttons, cutlery, and/or writing: Denies  Review of Systems  Constitutional: Negative for fatigue and night sweats.  HENT: Negative for mouth sores, mouth dryness and nose dryness.   Eyes: Negative for redness and dryness.  Respiratory: Negative for cough, hemoptysis, shortness of breath and difficulty breathing.   Cardiovascular: Negative for chest pain, palpitations, hypertension, irregular heartbeat and swelling in legs/feet.  Gastrointestinal: Negative for blood in stool, constipation and diarrhea.  Endocrine: Negative for increased urination.  Genitourinary: Negative for difficulty urinating and painful urination.  Musculoskeletal: Positive for arthralgias, joint pain and morning stiffness. Negative for joint swelling, myalgias, muscle weakness, muscle tenderness and myalgias.  Skin: Negative for color change, rash,  hair loss, nodules/bumps, skin tightness, ulcers and sensitivity to sunlight.  Allergic/Immunologic: Negative for susceptible to infections.  Neurological: Negative for dizziness, fainting, numbness, memory loss, night sweats and weakness.  Hematological: Negative for swollen glands.  Psychiatric/Behavioral: Negative for depressed mood and sleep disturbance. The patient is not nervous/anxious.     PMFS History:  Patient Active Problem List   Diagnosis Date Noted  . Sepsis ([place]) 09/12/2018  . CAP (community acquired pneumonia) 09/12/2018  . Hypertension   . Rheumatoid arthritis of multiple sites with negative rheumatoid factor ([place]) 03/07/2018  . Rheumatoid nodules ([place]) 03/07/2018  . High risk medication use 03/07/2018  . History of asthma 03/07/2018  . History of hypertension 03/07/2018  . History of hypercholesterolemia 03/07/2018  . History of coronary artery disease 03/07/2018  . Coronary atherosclerosis 04/27/2010  . NONSPECIFIC ABNORMAL FINDING IN STOOL CONTENTS 04/27/2010  . PERSONAL HISTORY OF COLONIC POLYPS 04/27/2010    Past Medical History:  Diagnosis Date  . Allergy   . Arthritis   . Asthma    oc flare in the past   . CAP (community acquired pneumonia)   . Cataract   . Heart murmur   . History of colon polyps   . Hyperlipidemia   . Hypertension   . Myocardial infarction ([place])    2 MI--1980's, 1999  . Sepsis ([place])     Family History  Problem Relation Age of Onset  . Colon cancer Sister   . Arthritis Mother   . Stroke Father   . Heart attack Father   . Heart Problems Brother   . Heart Problems Brother   . Gout Son   .  Colon polyps Neg Hx   . Esophageal cancer Neg Hx   . Rectal cancer Neg Hx   . Stomach cancer Neg Hx    Past Surgical History:  Procedure Laterality Date  . CARDIAC CATHETERIZATION    . CATARACT EXTRACTION Bilateral   . COLONOSCOPY    . CORONARY STENT PLACEMENT    . INGUINAL HERNIA REPAIR Bilateral   . POLYPECTOMY     Social  History   Social History Narrative  . Not on file    There is no immunization history on file for this patient.   Objective: Vital Signs: BP 122/69 (BP Location: Left Arm, Patient Position: Sitting, Cuff Size: Normal)   Pulse (!) 53   Resp 18   Ht 6\' 2"  (1.88 m)   Wt 210 lb 12.8 oz (95.6 kg)   BMI 27.07 kg/m    Physical Exam Vitals and nursing note reviewed.  Constitutional:      Appearance: He is well-developed.  HENT:     Head: Normocephalic and atraumatic.  Eyes:     Conjunctiva/sclera: Conjunctivae normal.     Pupils: Pupils are equal, round, and reactive to light.  Pulmonary:     Effort: Pulmonary effort is normal.  Abdominal:     General: Bowel sounds are normal.     Palpations: Abdomen is soft.  Musculoskeletal:     Cervical back: Normal range of motion and neck supple.  Skin:    General: Skin is warm and dry.     Capillary Refill: Capillary refill takes less than 2 seconds.  Neurological:     Mental Status: He is alert and oriented to person, place, and time.  Psychiatric:        Behavior: Behavior normal.      Musculoskeletal Exam:   C-spine slightly limited range of motion.  Shoulder joints have good range of motion with no discomfort.  Elbow joints have good range of motion no tenderness or inflammation.  He has limited range of motion of bilateral wrist joints with tenderness and synovitis.  MCPs, PIPs, DIPs have good range of motion.  He has complete fist formation bilaterally.  He has synovitis of the right third MCP.  Hip joints have good range of motion with no discomfort.  Knee joints have good range of motion with no warmth or effusion.  Ankle joints have good range of motion no tenderness or inflammation.  Thickening of bilateral first MTPs.  Overcrowding of toes noted.  Callus on the plantar aspect of the right third MTP noted.   CDAI Exam: CDAI Score: 7  Patient Global: 5 mm; Provider Global: 5 mm Swollen: 3 ; Tender: 3  Joint Exam 01/09/2020       Right  Left  Wrist  Swollen Tender  Swollen Tender  MCP 3  Swollen Tender        Investigation: No additional findings.  Imaging: No results found.  Recent Labs: Lab Results  Component Value Date   WBC 4.7 12/02/2019   HGB 13.1 (L) 12/02/2019   PLT 136 (L) 12/02/2019   NA 142 12/02/2019   K 4.9 12/02/2019   CL 107 12/02/2019   CO2 30 12/02/2019   GLUCOSE 88 12/02/2019   BUN 19 12/02/2019   CREATININE 1.13 12/02/2019   BILITOT 0.5 12/02/2019   ALKPHOS 107 04/18/2019   AST 30 12/02/2019   ALT 15 12/02/2019   PROT 5.9 (L) 12/02/2019   ALBUMIN 4.2 04/18/2019   CALCIUM 9.2 12/02/2019   GFRAA 71  12/02/2019    Speciality Comments: No specialty comments available.  Procedures:  No procedures performed Allergies: Patient has no known allergies.   Assessment / Plan:     Visit Diagnoses: Rheumatoid arthritis of multiple sites with negative rheumatoid factor (HCC) - +CCP with nodulosis: He has tenderness and synovitis of both wrist joints on exam.  He has synovitis of the right third MCP joint.  According to the patient he has occasional arthralgias but his discomfort is tolerable and he tries remains active.  He takes methotrexate 4 tablets by mouth once weekly and folic acid 1 mg by mouth daily.  He has not missed any doses of methotrexate recently.  We discussed increasing the dose of methotrexate but he declined at this time.  He will continue taking methotrexate 4 tablets by mouth once weekly and folic acid 1 mg by mouth daily.  A refill of methotrexate was sent to the pharmacy.  He was advised to notify us if he develops increased joint pain or joint swelling. He will follow-up in the office in 5 months.  His handicap placard form was filled out today while he was in the office.   Rheumatoid nodules ([place]): Chronic and unchanged.  He denied scheduling cortisone injections at this time.   High risk medication use -  MTX 4 tablets by mouth every week, folic acid 1 mg by mouth  daily.  CBC and CMP are drawn on 12/02/2019.  He will be due to update lab work in June and every 3 months.  Standing orders are in place.  He has not had any recent infections.  He received both COVID-19 vaccinations.  Other medical conditions are listed as follows:  History of hypercholesterolemia  History of coronary artery disease  History of hypertension  History of asthma  Orders: No orders of the defined types were placed in this encounter.  Meds ordered this encounter  Medications  . methotrexate (RHEUMATREX) 2.5 MG tablet    Sig: Take 4 tablets (10 mg total) by mouth once a week (Caution: chemotherapy. Protect from light)    Dispense:  48 tablet    Refill:  0     Follow-Up Instructions: Return in about 5 months (around 06/10/2020) for Rheumatoid arthritis.   Leonia Reader  I examined and evaluated the patient with Hazel Sams PA.  Patient had some synovitis on my examination of bilateral wrist joints today.  We discussed increasing methotrexate dose but he declined.  He would like to give it more time.  If he has persistent symptoms he will notify us.  The plan of care was discussed as noted above.  Bo Merino, MD  Note - This record has been created using Editor, commissioning.  Chart creation errors have been sought, but may not always  have been located. Such creation errors do not reflect on  the standard of medical care.

## 2020-01-09 ENCOUNTER — Ambulatory Visit: Payer: PPO | Admitting: Rheumatology

## 2020-01-09 ENCOUNTER — Encounter: Payer: Self-pay | Admitting: Rheumatology

## 2020-01-09 ENCOUNTER — Other Ambulatory Visit: Payer: Self-pay

## 2020-01-09 VITALS — BP 122/69 | HR 53 | Resp 18 | Ht 74.0 in | Wt 210.8 lb

## 2020-01-09 DIAGNOSIS — M0609 Rheumatoid arthritis without rheumatoid factor, multiple sites: Secondary | ICD-10-CM

## 2020-01-09 DIAGNOSIS — Z8709 Personal history of other diseases of the respiratory system: Secondary | ICD-10-CM | POA: Diagnosis not present

## 2020-01-09 DIAGNOSIS — Z8639 Personal history of other endocrine, nutritional and metabolic disease: Secondary | ICD-10-CM | POA: Diagnosis not present

## 2020-01-09 DIAGNOSIS — M0630 Rheumatoid nodule, unspecified site: Secondary | ICD-10-CM

## 2020-01-09 DIAGNOSIS — Z79899 Other long term (current) drug therapy: Secondary | ICD-10-CM | POA: Diagnosis not present

## 2020-01-09 DIAGNOSIS — Z8679 Personal history of other diseases of the circulatory system: Secondary | ICD-10-CM | POA: Diagnosis not present

## 2020-01-09 MED ORDER — METHOTREXATE 2.5 MG PO TABS: ORAL_TABLET | ORAL | 0 refills | Status: DC

## 2020-01-09 NOTE — Patient Instructions (Signed)
Standing Labs We placed an order today for your standing lab work.    Please come back and get your standing labs in June and every 3 months   We have open lab daily Monday through Thursday from 8:30-12:30 PM and 1:30-4:30 PM and Friday from 8:30-12:30 PM and 1:30-4:00 PM at the office of Dr. Shaili Deveshwar.   You may experience shorter wait times on Monday and Friday afternoons. The office is located at [address], [address], [place], [place] [address] No appointment is necessary.   Labs are drawn by [facility].  You may receive a bill from [facility] for your lab work.  If you wish to have your labs drawn at another location, please call the office 24 hours in advance to send orders.  If you have any questions regarding directions or hours of operation,  please call [phone].   Just as a reminder please drink plenty of water prior to coming for your lab work. Thanks!   

## 2020-02-12 ENCOUNTER — Other Ambulatory Visit: Payer: Self-pay | Admitting: Sports Medicine

## 2020-02-12 ENCOUNTER — Encounter: Payer: Self-pay | Admitting: Sports Medicine

## 2020-02-12 ENCOUNTER — Other Ambulatory Visit: Payer: Self-pay

## 2020-02-12 ENCOUNTER — Ambulatory Visit (INDEPENDENT_AMBULATORY_CARE_PROVIDER_SITE_OTHER): Payer: PPO | Admitting: Sports Medicine

## 2020-02-12 DIAGNOSIS — M79671 Pain in right foot: Secondary | ICD-10-CM | POA: Diagnosis not present

## 2020-02-12 DIAGNOSIS — L909 Atrophic disorder of skin, unspecified: Secondary | ICD-10-CM

## 2020-02-12 DIAGNOSIS — Q828 Other specified congenital malformations of skin: Secondary | ICD-10-CM

## 2020-02-12 DIAGNOSIS — M2042 Other hammer toe(s) (acquired), left foot: Secondary | ICD-10-CM

## 2020-02-12 DIAGNOSIS — M0609 Rheumatoid arthritis without rheumatoid factor, multiple sites: Secondary | ICD-10-CM

## 2020-02-12 DIAGNOSIS — M2041 Other hammer toe(s) (acquired), right foot: Secondary | ICD-10-CM

## 2020-02-12 DIAGNOSIS — M21619 Bunion of unspecified foot: Secondary | ICD-10-CM | POA: Diagnosis not present

## 2020-02-12 NOTE — Progress Notes (Signed)
Subjective: Carlos Patton is a 79 y.o. male patient who presents to office for evaluation of Right foot pain secondary to callus skin. Patient complains of pain at the lesion present Right foot at the ball for the last 2 months reports that his soreness with certain shoes or if he takes a wrong step.  Patient denies redness warmth swelling drainage or any open lesion at the area.  Patient has tried changing shoes with no complete relief in symptoms. Patient denies any other pedal complaints.   Patient denies a history of diabetes.  Currently is taking aspirin 325 mg.  Review of Systems  All other systems reviewed and are negative.    Patient Active Problem List   Diagnosis Date Noted  . Sepsis ([place]) 09/12/2018  . CAP (community acquired pneumonia) 09/12/2018  . Hypertension   . Rheumatoid arthritis of multiple sites with negative rheumatoid factor ([place]) 03/07/2018  . Rheumatoid nodules ([place]) 03/07/2018  . High risk medication use 03/07/2018  . History of asthma 03/07/2018  . History of hypertension 03/07/2018  . History of hypercholesterolemia 03/07/2018  . History of coronary artery disease 03/07/2018  . Benign prostatic hyperplasia with urinary obstruction 06/24/2016  . Elevated prostate specific antigen (PSA) 07/28/2011  . Coronary atherosclerosis 04/27/2010  . NONSPECIFIC ABNORMAL FINDING IN STOOL CONTENTS 04/27/2010  . PERSONAL HISTORY OF COLONIC POLYPS 04/27/2010    Current Outpatient Medications on File Prior to Visit  Medication Sig Dispense Refill  . aspirin 325 MG tablet Take 325 mg by mouth at bedtime.     Marland Kitchen atenolol (TENORMIN) 25 MG tablet Take 25 mg by mouth daily.     . methotrexate (RHEUMATREX) 2.5 MG tablet Take 4 tablets (10 mg total) by mouth once a week (Caution: chemotherapy. Protect from light) 48 tablet 0   No current facility-administered medications on file prior to visit.    No Known Allergies  Objective:  General: Alert and oriented x3 in no  acute distress  Dermatology: Keratotic lesion present submet 2 on right with skin lines transversing the lesion, pain is present with direct pressure to the lesion with a central nucleated core noted, no webspace macerations, no ecchymosis bilateral, all nails x 10 are well manicured.  Vascular: Dorsalis Pedis and Posterior Tibial pedal pulses 1/4, Capillary Fill Time 5 seconds, scant pedal hair growth bilateral, trace edema bilateral lower extremities, Temperature gradient within normal limits.  Neurology: Johney Maine sensation intact via light touch bilateral.  Musculoskeletal: Mild tenderness with palpation at the keratotic lesion site on Right, significant bunion hammertoe and fat pad atrophy deformity noted bilateral right greater than left.  Assessment and Plan: Problem List Items Addressed This Visit      Musculoskeletal and Integument   Rheumatoid arthritis of multiple sites with negative rheumatoid factor ([place])    Other Visit Diagnoses    Porokeratosis    -  Primary   Right foot pain       Bunion       Hammertoes of both feet       Fat pad atrophy of foot          -Complete examination performed -Discussed treatment options for painful keratosis on right foot -Parred keratoic lesion using a chisel blade; treated the area withSalinocaine covered with Band-Aid -Encouraged daily skin emollients -Encouraged use of pumice stone -Advised good supportive shoes and inserts; applied offloading padding to shoes and advised patient that he may do the same to other shoes to decrease pressure at the ball of  the right foot -Patient to return to office as needed or sooner if condition worsens.  Landis Martins, DPM

## 2020-02-14 ENCOUNTER — Telehealth: Payer: Self-pay | Admitting: Sports Medicine

## 2020-02-14 NOTE — Telephone Encounter (Signed)
Lvm for pt to let them know their copay was posted in [place]. I voided the payment and they will be receiving a bill.

## 2020-02-19 ENCOUNTER — Other Ambulatory Visit: Payer: Self-pay

## 2020-02-19 DIAGNOSIS — Z79899 Other long term (current) drug therapy: Secondary | ICD-10-CM

## 2020-02-19 DIAGNOSIS — R5383 Other fatigue: Secondary | ICD-10-CM | POA: Diagnosis not present

## 2020-02-19 DIAGNOSIS — I10 Essential (primary) hypertension: Secondary | ICD-10-CM | POA: Diagnosis not present

## 2020-02-19 DIAGNOSIS — K5900 Constipation, unspecified: Secondary | ICD-10-CM | POA: Diagnosis not present

## 2020-02-19 LAB — COMPLETE METABOLIC PANEL WITH GFR
AG Ratio: 1.9 (calc) (ref 1.0–2.5)
ALT: 13 U/L (ref 9–46)
AST: 25 U/L (ref 10–35)
Albumin: 3.9 g/dL (ref 3.6–5.1)
Alkaline phosphatase (APISO): 97 U/L (ref 35–144)
BUN: 20 mg/dL (ref 7–25)
CO2: 28 mmol/L (ref 20–32)
Calcium: 9.5 mg/dL (ref 8.6–10.3)
Chloride: 107 mmol/L (ref 98–110)
Creat: 1.11 mg/dL (ref 0.70–1.18)
GFR, Est African American: 73 mL/min/{1.73_m2} (ref >=60)
GFR, Est Non African American: 63 mL/min/{1.73_m2} (ref >=60)
Globulin: 2.1 g/dL (calc) (ref 1.9–3.7)
Glucose, Bld: 77 mg/dL (ref 65–99)
Potassium: 4.3 mmol/L (ref 3.5–5.3)
Sodium: 140 mmol/L (ref 135–146)
Total Bilirubin: 0.5 mg/dL (ref 0.2–1.2)
Total Protein: 6 g/dL — ABNORMAL LOW (ref 6.1–8.1)

## 2020-02-19 LAB — CBC WITH DIFFERENTIAL/PLATELET
Absolute Monocytes: 478 cells/uL (ref 200–950)
Basophils Absolute: 41 cells/uL (ref 0–200)
Basophils Relative: 0.9 %
Eosinophils Absolute: 267 cells/uL (ref 15–500)
Eosinophils Relative: 5.8 %
HCT: 37.2 % — ABNORMAL LOW (ref 38.5–50.0)
Hemoglobin: 12.3 g/dL — ABNORMAL LOW (ref 13.2–17.1)
Lymphs Abs: 685 cells/uL — ABNORMAL LOW (ref 850–3900)
MCH: 32.6 pg (ref 27.0–33.0)
MCHC: 33.1 g/dL (ref 32.0–36.0)
MCV: 98.7 fL (ref 80.0–100.0)
MPV: 11.7 fL (ref 7.5–12.5)
Monocytes Relative: 10.4 %
Neutro Abs: 3128 cells/uL (ref 1500–7800)
Neutrophils Relative %: 68 %
Platelets: 144 10*3/uL (ref 140–400)
RBC: 3.77 10*6/uL — ABNORMAL LOW (ref 4.20–5.80)
RDW: 12.5 % (ref 11.0–15.0)
Total Lymphocyte: 14.9 %
WBC: 4.6 10*3/uL (ref 3.8–10.8)

## 2020-02-20 NOTE — Progress Notes (Signed)
Labs are stable.  Mild anemia noted.  Please forward labs to his PCP.

## 2020-04-03 ENCOUNTER — Other Ambulatory Visit: Payer: Self-pay | Admitting: *Deleted

## 2020-04-03 MED ORDER — METHOTREXATE 2.5 MG PO TABS: ORAL_TABLET | ORAL | 0 refills | Status: DC

## 2020-04-03 NOTE — Telephone Encounter (Signed)
Refill request received via fax  Last Visit: 01/09/2020 Next Visit: 06/19/2020 Labs: 02/19/2020 stable   Current Dose per office note 01/09/2020: methotrexate 4 tablets by mouth once weekly  DX: Rheumatoid arthritis   Okay to refill per Dr. Estanislado Pandy

## 2020-04-13 DIAGNOSIS — H353132 Nonexudative age-related macular degeneration, bilateral, intermediate dry stage: Secondary | ICD-10-CM | POA: Diagnosis not present

## 2020-04-13 DIAGNOSIS — H02403 Unspecified ptosis of bilateral eyelids: Secondary | ICD-10-CM | POA: Diagnosis not present

## 2020-04-13 DIAGNOSIS — D3132 Benign neoplasm of left choroid: Secondary | ICD-10-CM | POA: Diagnosis not present

## 2020-04-13 DIAGNOSIS — H26491 Other secondary cataract, right eye: Secondary | ICD-10-CM | POA: Diagnosis not present

## 2020-04-26 DIAGNOSIS — J181 Lobar pneumonia, unspecified organism: Secondary | ICD-10-CM | POA: Diagnosis not present

## 2020-04-26 DIAGNOSIS — Z20828 Contact with and (suspected) exposure to other viral communicable diseases: Secondary | ICD-10-CM | POA: Diagnosis not present

## 2020-06-05 ENCOUNTER — Other Ambulatory Visit: Payer: Self-pay | Admitting: Rheumatology

## 2020-06-05 NOTE — Progress Notes (Signed)
Office Visit Note  Patient: Carlos Patton             Date of Birth: 1941/08/24           MRN: [id number]             PCP: Velna Hatchet, MD Referring: Velna Hatchet, MD Visit Date: 06/19/2020 Occupation: @GUAROCC @  Subjective:  Other (nodules on hands, knot on center of back )   History of Present Illness: Carlos Patton is a 79 y.o. male with history of seronegative rheumatoid arthritis.  He has been doing well on methotrexate 4 tablets p.o. weekly along with folic acid.  He states the nodulosis on his hands is improving.  He denies any joint swelling or joint pain.  He states he has had a knot on his back which he would like for me to see.  Activities of Daily Living:  Patient reports morning stiffness for 5 minutes.   Patient Denies nocturnal pain.  Difficulty dressing/grooming: Denies Difficulty climbing stairs: Denies Difficulty getting out of chair: Denies Difficulty using hands for taps, buttons, cutlery, and/or writing: Denies  Review of Systems  Constitutional: Positive for fatigue.  HENT: Negative for mouth sores, mouth dryness and nose dryness.   Eyes: Negative for pain, itching and dryness.  Respiratory: Negative for shortness of breath, wheezing and difficulty breathing.   Cardiovascular: Positive for swelling in legs/feet. Negative for chest pain and palpitations.  Gastrointestinal: Negative for blood in stool, constipation and diarrhea.  Endocrine: Negative for increased urination.  Genitourinary: Negative for difficulty urinating.  Musculoskeletal: Positive for arthralgias, joint pain and morning stiffness. Negative for joint swelling, myalgias, muscle tenderness and myalgias.  Skin: Negative for color change, rash and redness.  Allergic/Immunologic: Negative for susceptible to infections.  Neurological: Negative for dizziness, numbness, headaches, memory loss and weakness.  Hematological: Positive for bruising/bleeding tendency.    Psychiatric/Behavioral: Negative for confusion and sleep disturbance.    PMFS History:  Patient Active Problem List   Diagnosis Date Noted  . Sepsis ([place]) 09/12/2018  . CAP (community acquired pneumonia) 09/12/2018  . Hypertension   . Rheumatoid arthritis of multiple sites with negative rheumatoid factor ([place]) 03/07/2018  . Rheumatoid nodules ([place]) 03/07/2018  . High risk medication use 03/07/2018  . History of asthma 03/07/2018  . History of hypertension 03/07/2018  . History of hypercholesterolemia 03/07/2018  . History of coronary artery disease 03/07/2018  . Benign prostatic hyperplasia with urinary obstruction 06/24/2016  . Elevated prostate specific antigen (PSA) 07/28/2011  . Coronary atherosclerosis 04/27/2010  . NONSPECIFIC ABNORMAL FINDING IN STOOL CONTENTS 04/27/2010  . PERSONAL HISTORY OF COLONIC POLYPS 04/27/2010    Past Medical History:  Diagnosis Date  . Allergy   . Arthritis   . Asthma    oc flare in the past   . CAP (community acquired pneumonia)   . Cataract   . Heart murmur   . History of colon polyps   . Hyperlipidemia   . Hypertension   . Myocardial infarction ([place])    2 MI--1980's, 1999  . Sepsis ([place])     Family History  Problem Relation Age of Onset  . Colon cancer Sister   . Arthritis Mother   . Stroke Father   . Heart attack Father   . Heart Problems Brother   . Heart Problems Brother   . Gout Son   . Colon polyps Neg Hx   . Esophageal cancer Neg Hx   . Rectal cancer Neg Hx   .  Stomach cancer Neg Hx    Past Surgical History:  Procedure Laterality Date  . CARDIAC CATHETERIZATION    . CATARACT EXTRACTION Bilateral   . COLONOSCOPY    . CORONARY STENT PLACEMENT    . INGUINAL HERNIA REPAIR Bilateral   . POLYPECTOMY     Social History   Social History Narrative  . Not on file   Immunization History  Administered Date(s) Administered  . Moderna SARS-COVID-2 Vaccination 10/25/2019, 11/22/2019     Objective: Vital Signs: BP  (!) 152/85 (BP Location: Left Arm, Patient Position: Sitting, Cuff Size: Normal)   Pulse 60   Resp 16   Ht 6\' 2"  (1.88 m)   Wt 205 lb (93 kg)   BMI 26.32 kg/m    Physical Exam Vitals and nursing note reviewed.  Constitutional:      Appearance: He is well-developed.  HENT:     Head: Normocephalic and atraumatic.  Eyes:     Conjunctiva/sclera: Conjunctivae normal.     Pupils: Pupils are equal, round, and reactive to light.  Cardiovascular:     Rate and Rhythm: Normal rate and regular rhythm.     Heart sounds: Normal heart sounds.  Pulmonary:     Effort: Pulmonary effort is normal.     Breath sounds: Normal breath sounds.  Abdominal:     General: Bowel sounds are normal.     Palpations: Abdomen is soft.  Musculoskeletal:     Cervical back: Normal range of motion and neck supple.  Skin:    General: Skin is warm and dry.     Capillary Refill: Capillary refill takes less than 2 seconds.  Neurological:     Mental Status: He is alert and oriented to person, place, and time.  Psychiatric:        Behavior: Behavior normal.      Musculoskeletal Exam: C-spine was in good range of motion.  Some thoracic kyphosis.  He has some protuberance of the lumbar vertebrae in the back which she is concerned about.  Shoulder joints, elbow joints, wrist joints, MCPs and PIPs with good range of motion with no synovitis.  Mild PIP and DIP thickening was noted.  Hip joints, knee joints, ankles, MTPs and PIPs with good range of motion with no synovitis.  CDAI Exam: CDAI Score: 0.2  Patient Global: 1 mm; Provider Global: 1 mm Swollen: 0 ; Tender: 0  Joint Exam 06/19/2020   No joint exam has been documented for this visit   There is currently no information documented on the homunculus. Go to the Rheumatology activity and complete the homunculus joint exam.  Investigation: No additional findings.  Imaging: No results found.  Recent Labs: Lab Results  Component Value Date   WBC 5.6  06/12/2020   HGB 12.9 (L) 06/12/2020   PLT 179 06/12/2020   NA 142 06/12/2020   K 4.1 06/12/2020   CL 107 06/12/2020   CO2 29 06/12/2020   GLUCOSE 61 (L) 06/12/2020   BUN 18 06/12/2020   CREATININE 1.15 06/12/2020   BILITOT 0.5 06/12/2020   ALKPHOS 107 04/18/2019   AST 21 06/12/2020   ALT 12 06/12/2020   PROT 5.9 (L) 06/12/2020   ALBUMIN 4.2 04/18/2019   CALCIUM 9.5 06/12/2020   GFRAA 70 06/12/2020    Speciality Comments: No specialty comments available.  Procedures:  No procedures performed Allergies: Patient has no known allergies.   Assessment / Plan:     Visit Diagnoses: Rheumatoid arthritis of multiple sites with negative rheumatoid factor (  [place]) - +CCP with nodulosis: He is clinically doing well without any synovitis.  He has been tolerating methotrexate well.  Rheumatoid nodules (HCC)-he has noticed improvement in the nodulosis and he was concerned about that.  He had detailed discussion regarding it.  High risk medication use - MTX 4 tablets by mouth every week, folic acid 1 mg by mouth daily.  Labs were normal in September.  Have advised him to get labs every 3 months to monitor for drug toxicity.  Chronic midline low back pain without sciatica -he has some protuberance of the lumbar vertebrae he is concerned about.  We will get x-ray today.  Plan: XR Lumbar Spine 2-3 Views.  X-ray showed severe dextroscoliosis, severe multilevel spondylosis and facet joint arthropathy.  There is questionable for compression fracture of T11 and T12.  Patient was advised to get a DEXA scan.  He currently does not have any radiculopathy.  I advised him to contact me in case his symptoms get worse.  History of hypercholesterolemia  History of coronary artery disease  History of hypertension-his blood pressure is elevated.  He has been advised to monitor blood pressure closely.  History of asthma  Osteoporosis screening-based on his age he needs a DEXA scan.  He will discuss that with  his PCP.  Educated about COVID-19 virus infection-he is fully vaccinated against COVID-19.  He has been advised to get a booster once available to him.  He will have to delay methotrexate by 1 week after the booster.  I also discussed the possible use of monoclonal antibody infusion in case he gets COVID-19 infection.  Orders: Orders Placed This Encounter  Procedures  . XR Lumbar Spine 2-3 Views   No orders of the defined types were placed in this encounter.     Follow-Up Instructions: Return in about 5 months (around 11/17/2020) for Rheumatoid arthritis.   Bo Merino, MD  Note - This record has been created using Editor, commissioning.  Chart creation errors have been sought, but may not always  have been located. Such creation errors do not reflect on  the standard of medical care.

## 2020-06-08 NOTE — Telephone Encounter (Signed)
Last Visit: 01/09/2020 Next Visit: 06/19/2020 Labs: 02/19/2020 stable   Current Dose per office note 01/09/2020: methotrexate 4 tablets by mouth once weekly  DX: Rheumatoid arthritis   Patient advised he is due to update labs.   Okay to refill 30 day supply MTX?

## 2020-06-12 ENCOUNTER — Other Ambulatory Visit: Payer: Self-pay

## 2020-06-12 DIAGNOSIS — Z79899 Other long term (current) drug therapy: Secondary | ICD-10-CM | POA: Diagnosis not present

## 2020-06-12 LAB — CBC WITH DIFFERENTIAL/PLATELET
Absolute Monocytes: 426 cells/uL (ref 200–950)
Basophils Absolute: 22 cells/uL (ref 0–200)
Basophils Relative: 0.4 %
Eosinophils Absolute: 246 cells/uL (ref 15–500)
Eosinophils Relative: 4.4 %
HCT: 38.9 % (ref 38.5–50.0)
Hemoglobin: 12.9 g/dL — ABNORMAL LOW (ref 13.2–17.1)
Lymphs Abs: 605 cells/uL — ABNORMAL LOW (ref 850–3900)
MCH: 33.1 pg — ABNORMAL HIGH (ref 27.0–33.0)
MCHC: 33.2 g/dL (ref 32.0–36.0)
MCV: 99.7 fL (ref 80.0–100.0)
MPV: 10.6 fL (ref 7.5–12.5)
Monocytes Relative: 7.6 %
Neutro Abs: 4301 cells/uL (ref 1500–7800)
Neutrophils Relative %: 76.8 %
Platelets: 179 10*3/uL (ref 140–400)
RBC: 3.9 10*6/uL — ABNORMAL LOW (ref 4.20–5.80)
RDW: 13.7 % (ref 11.0–15.0)
Total Lymphocyte: 10.8 %
WBC: 5.6 10*3/uL (ref 3.8–10.8)

## 2020-06-12 LAB — COMPLETE METABOLIC PANEL WITH GFR
AG Ratio: 2.1 (calc) (ref 1.0–2.5)
ALT: 12 U/L (ref 9–46)
AST: 21 U/L (ref 10–35)
Albumin: 4 g/dL (ref 3.6–5.1)
Alkaline phosphatase (APISO): 93 U/L (ref 35–144)
BUN: 18 mg/dL (ref 7–25)
CO2: 29 mmol/L (ref 20–32)
Calcium: 9.5 mg/dL (ref 8.6–10.3)
Chloride: 107 mmol/L (ref 98–110)
Creat: 1.15 mg/dL (ref 0.70–1.18)
GFR, Est African American: 70 mL/min/{1.73_m2} (ref >=60)
GFR, Est Non African American: 60 mL/min/{1.73_m2} (ref >=60)
Globulin: 1.9 g/dL (calc) (ref 1.9–3.7)
Glucose, Bld: 61 mg/dL — ABNORMAL LOW (ref 65–99)
Potassium: 4.1 mmol/L (ref 3.5–5.3)
Sodium: 142 mmol/L (ref 135–146)
Total Bilirubin: 0.5 mg/dL (ref 0.2–1.2)
Total Protein: 5.9 g/dL — ABNORMAL LOW (ref 6.1–8.1)

## 2020-06-14 NOTE — Progress Notes (Signed)
Mild anemia noted . Labs are stable.

## 2020-06-19 ENCOUNTER — Other Ambulatory Visit: Payer: Self-pay

## 2020-06-19 ENCOUNTER — Encounter: Payer: Self-pay | Admitting: Rheumatology

## 2020-06-19 ENCOUNTER — Ambulatory Visit (INDEPENDENT_AMBULATORY_CARE_PROVIDER_SITE_OTHER): Payer: PPO | Admitting: Rheumatology

## 2020-06-19 ENCOUNTER — Ambulatory Visit: Payer: Self-pay

## 2020-06-19 VITALS — BP 152/85 | HR 60 | Resp 16 | Ht 74.0 in | Wt 205.0 lb

## 2020-06-19 DIAGNOSIS — Z13820 Encounter for screening for osteoporosis: Secondary | ICD-10-CM

## 2020-06-19 DIAGNOSIS — Z8679 Personal history of other diseases of the circulatory system: Secondary | ICD-10-CM

## 2020-06-19 DIAGNOSIS — Z79899 Other long term (current) drug therapy: Secondary | ICD-10-CM | POA: Diagnosis not present

## 2020-06-19 DIAGNOSIS — G8929 Other chronic pain: Secondary | ICD-10-CM

## 2020-06-19 DIAGNOSIS — M0630 Rheumatoid nodule, unspecified site: Secondary | ICD-10-CM

## 2020-06-19 DIAGNOSIS — Z8709 Personal history of other diseases of the respiratory system: Secondary | ICD-10-CM

## 2020-06-19 DIAGNOSIS — M0609 Rheumatoid arthritis without rheumatoid factor, multiple sites: Secondary | ICD-10-CM | POA: Diagnosis not present

## 2020-06-19 DIAGNOSIS — Z8639 Personal history of other endocrine, nutritional and metabolic disease: Secondary | ICD-10-CM | POA: Diagnosis not present

## 2020-06-19 DIAGNOSIS — M545 Low back pain, unspecified: Secondary | ICD-10-CM

## 2020-06-19 DIAGNOSIS — Z7189 Other specified counseling: Secondary | ICD-10-CM

## 2020-06-19 NOTE — Patient Instructions (Addendum)
COVID-19 vaccine recommendations:   COVID-19 vaccine is recommended for everyone (unless you are allergic to a vaccine component), even if you are on a medication that suppresses your immune system.   If you are on Methotrexate, Cellcept (mycophenolate), Rinvoq, Morrie Sheldon, and Olumiant- hold the medication for 1 week after each vaccine. Hold Methotrexate for 2 weeks after the single dose COVID-19 vaccine.   Do not take Tylenol or any anti-inflammatory medications (NSAIDs) 24 hours prior to the COVID-19 vaccination.   There is no direct evidence about the efficacy of the COVID-19 vaccine in individuals who are on medications that suppress the immune system.   Even if you are fully vaccinated, and you are on any medications that suppress your immune system, please continue to wear a mask, maintain at least six feet social distance and practice hand hygiene.   If you develop a COVID-19 infection, please contact your PCP or our office to determine if you need antibody infusion.  The booster vaccine is now available for immunocompromised patients. It is advised that if you had Pfizer vaccine you should get [employer] booster.  If you had a Moderna vaccine then you should get a Moderna booster. Johnson and Wynetta Emery does not have a booster vaccine at this time.  Please see the following web sites for updated information.   https://[email or web address]/[email or web address]/[email or web address]-Vaccination-Patient-Resources.[email or web address]  [email or web address]://[email or web address]/About-[email or web address]-Releases/ID/1159  Standing Labs We placed an order today for your standing lab work.   Please have your standing labs drawn in December and every 3 months  If possible, please have your labs drawn 2 weeks prior to your appointment so that the provider can discuss your results at your appointment.  We have open lab daily Monday through Thursday from 8:30-12:30 PM and 1:30-4:30 PM and Friday from 8:30-12:30 PM and 1:30-4:00 PM at the office of  Dr. Bo Merino, [facility].   Please be advised, patients with office appointments requiring lab work will take precedents over walk-in lab work.  If possible, please come for your lab work on Monday and Friday afternoons, as you may experience shorter wait times. The office is located at [address] [place], [place],  [address] No appointment is necessary.   Labs are drawn by [facility]. Please bring your co-pay at the time of your lab draw.  You may receive a bill from [facility] for your lab work.  If you wish to have your labs drawn at another location, please call the office 24 hours in advance to send orders.  If you have any questions regarding directions or hours of operation,  please call [phone].   As a reminder, please drink plenty of water prior to coming for your lab work. Thanks!

## 2020-08-19 DIAGNOSIS — E785 Hyperlipidemia, unspecified: Secondary | ICD-10-CM | POA: Diagnosis not present

## 2020-08-19 DIAGNOSIS — E559 Vitamin D deficiency, unspecified: Secondary | ICD-10-CM | POA: Diagnosis not present

## 2020-08-19 DIAGNOSIS — Z125 Encounter for screening for malignant neoplasm of prostate: Secondary | ICD-10-CM | POA: Diagnosis not present

## 2020-08-24 DIAGNOSIS — N401 Enlarged prostate with lower urinary tract symptoms: Secondary | ICD-10-CM | POA: Diagnosis not present

## 2020-08-24 DIAGNOSIS — R972 Elevated prostate specific antigen [PSA]: Secondary | ICD-10-CM | POA: Diagnosis not present

## 2020-08-24 DIAGNOSIS — N138 Other obstructive and reflux uropathy: Secondary | ICD-10-CM | POA: Diagnosis not present

## 2020-08-26 DIAGNOSIS — D84821 Immunodeficiency due to drugs: Secondary | ICD-10-CM | POA: Diagnosis not present

## 2020-08-26 DIAGNOSIS — R059 Cough, unspecified: Secondary | ICD-10-CM | POA: Diagnosis not present

## 2020-08-26 DIAGNOSIS — Z1212 Encounter for screening for malignant neoplasm of rectum: Secondary | ICD-10-CM | POA: Diagnosis not present

## 2020-08-26 DIAGNOSIS — K5900 Constipation, unspecified: Secondary | ICD-10-CM | POA: Diagnosis not present

## 2020-08-26 DIAGNOSIS — R42 Dizziness and giddiness: Secondary | ICD-10-CM | POA: Diagnosis not present

## 2020-08-26 DIAGNOSIS — M069 Rheumatoid arthritis, unspecified: Secondary | ICD-10-CM | POA: Diagnosis not present

## 2020-08-26 DIAGNOSIS — Z23 Encounter for immunization: Secondary | ICD-10-CM | POA: Diagnosis not present

## 2020-08-26 DIAGNOSIS — I10 Essential (primary) hypertension: Secondary | ICD-10-CM | POA: Diagnosis not present

## 2020-08-26 DIAGNOSIS — R82998 Other abnormal findings in urine: Secondary | ICD-10-CM | POA: Diagnosis not present

## 2020-08-26 DIAGNOSIS — D692 Other nonthrombocytopenic purpura: Secondary | ICD-10-CM | POA: Diagnosis not present

## 2020-08-26 DIAGNOSIS — E785 Hyperlipidemia, unspecified: Secondary | ICD-10-CM | POA: Diagnosis not present

## 2020-08-26 DIAGNOSIS — I700 Atherosclerosis of aorta: Secondary | ICD-10-CM | POA: Diagnosis not present

## 2020-08-26 DIAGNOSIS — I2510 Atherosclerotic heart disease of native coronary artery without angina pectoris: Secondary | ICD-10-CM | POA: Diagnosis not present

## 2020-08-26 DIAGNOSIS — Z0000 Encounter for general adult medical examination without abnormal findings: Secondary | ICD-10-CM | POA: Diagnosis not present

## 2020-09-14 ENCOUNTER — Other Ambulatory Visit: Payer: Self-pay | Admitting: *Deleted

## 2020-09-14 DIAGNOSIS — Z79899 Other long term (current) drug therapy: Secondary | ICD-10-CM

## 2020-09-14 LAB — COMPLETE METABOLIC PANEL WITH GFR
AG Ratio: 1.6 (calc) (ref 1.0–2.5)
ALT: 15 U/L (ref 9–46)
AST: 26 U/L (ref 10–35)
Albumin: 4 g/dL (ref 3.6–5.1)
Alkaline phosphatase (APISO): 88 U/L (ref 35–144)
BUN/Creatinine Ratio: 16 (calc) (ref 6–22)
BUN: 20 mg/dL (ref 7–25)
CO2: 29 mmol/L (ref 20–32)
Calcium: 9.8 mg/dL (ref 8.6–10.3)
Chloride: 105 mmol/L (ref 98–110)
Creat: 1.23 mg/dL — ABNORMAL HIGH (ref 0.70–1.11)
GFR, Est African American: 64 mL/min/{1.73_m2} (ref >=60)
GFR, Est Non African American: 55 mL/min/{1.73_m2} — ABNORMAL LOW (ref >=60)
Globulin: 2.5 g/dL (calc) (ref 1.9–3.7)
Glucose, Bld: 97 mg/dL (ref 65–99)
Potassium: 4.7 mmol/L (ref 3.5–5.3)
Sodium: 140 mmol/L (ref 135–146)
Total Bilirubin: 0.5 mg/dL (ref 0.2–1.2)
Total Protein: 6.5 g/dL (ref 6.1–8.1)

## 2020-09-14 LAB — CBC WITH DIFFERENTIAL/PLATELET
Absolute Monocytes: 442 cells/uL (ref 200–950)
Basophils Absolute: 41 cells/uL (ref 0–200)
Basophils Relative: 0.9 %
Eosinophils Absolute: 271 cells/uL (ref 15–500)
Eosinophils Relative: 5.9 %
HCT: 40.9 % (ref 38.5–50.0)
Hemoglobin: 13.9 g/dL (ref 13.2–17.1)
Lymphs Abs: 690 cells/uL — ABNORMAL LOW (ref 850–3900)
MCH: 32.8 pg (ref 27.0–33.0)
MCHC: 34 g/dL (ref 32.0–36.0)
MCV: 96.5 fL (ref 80.0–100.0)
MPV: 11.2 fL (ref 7.5–12.5)
Monocytes Relative: 9.6 %
Neutro Abs: 3156 cells/uL (ref 1500–7800)
Neutrophils Relative %: 68.6 %
Platelets: 195 10*3/uL (ref 140–400)
RBC: 4.24 10*6/uL (ref 4.20–5.80)
RDW: 12.2 % (ref 11.0–15.0)
Total Lymphocyte: 15 %
WBC: 4.6 10*3/uL (ref 3.8–10.8)

## 2020-09-15 NOTE — Progress Notes (Signed)
Lymphocyte count is low due to medication use but is stable. Creatinine is elevated. His creatinine level fluctuates. I would recommend repeat BMP in 1 month. We may consider switching him from methotrexate to leflunomide if the creatinine stays elevated.

## 2020-09-16 ENCOUNTER — Telehealth: Payer: Self-pay | Admitting: *Deleted

## 2020-09-16 DIAGNOSIS — Z79899 Other long term (current) drug therapy: Secondary | ICD-10-CM

## 2020-09-16 NOTE — Telephone Encounter (Signed)
-----   Message from Pollyann Savoy, MD sent at 09/15/2020  9:36 AM EST ----- Lymphocyte count is low due to medication use but is stable. Creatinine is elevated. His creatinine level fluctuates. I would recommend repeat BMP in 1 month. We may consider switching him from methotrexate to leflunomide if the creatinine stays elev ated.

## 2020-10-15 ENCOUNTER — Other Ambulatory Visit: Payer: Self-pay | Admitting: *Deleted

## 2020-10-15 DIAGNOSIS — Z79899 Other long term (current) drug therapy: Secondary | ICD-10-CM | POA: Diagnosis not present

## 2020-10-16 ENCOUNTER — Other Ambulatory Visit: Payer: Self-pay

## 2020-10-16 LAB — BASIC METABOLIC PANEL WITH GFR
BUN/Creatinine Ratio: 18 (calc) (ref 6–22)
BUN: 21 mg/dL (ref 7–25)
CO2: 30 mmol/L (ref 20–32)
Calcium: 9.9 mg/dL (ref 8.6–10.3)
Chloride: 106 mmol/L (ref 98–110)
Creat: 1.19 mg/dL — ABNORMAL HIGH (ref 0.70–1.11)
GFR, Est African American: 66 mL/min/{1.73_m2} (ref >=60)
GFR, Est Non African American: 57 mL/min/{1.73_m2} — ABNORMAL LOW (ref >=60)
Glucose, Bld: 96 mg/dL (ref 65–99)
Potassium: 4.1 mmol/L (ref 3.5–5.3)
Sodium: 140 mmol/L (ref 135–146)

## 2020-10-16 NOTE — Telephone Encounter (Signed)
Last Visit: 06/19/2020 Next Visit: 11/20/2020 Labs: 09/14/2020 Lymphocyte count is low due to medication use but is stable. Creatinine is elevated. BMP 10/15/2020 Creatinine is low but improved. Please advise patient to continue current treatment.   Current Dose per office note 06/19/2020: MTX 4 tablets by mouth every week DX: Rheumatoid arthritis of multiple sites with negative rheumatoid factor  Okay to refill MTX?

## 2020-10-16 NOTE — Progress Notes (Signed)
Creatinine is low but improved.  Please advise patient to continue current treatment.  Repeat labs in 3 months.

## 2020-10-16 NOTE — Telephone Encounter (Signed)
Patient called requesting prescription refill of Methotrexate to be sent to [facility].

## 2020-10-19 MED ORDER — METHOTREXATE SODIUM 2.5 MG PO TABS
ORAL_TABLET | ORAL | 0 refills | Status: DC
Start: 2020-10-19 — End: 2020-12-21

## 2020-11-06 NOTE — Progress Notes (Addendum)
Office Visit Note  Patient: Carlos Patton             Date of Birth: 06-19-1941           MRN: [id number]             PCP: Velna Hatchet, MD Referring: Velna Hatchet, MD Visit Date: 11/20/2020 Occupation: @GUAROCC @  Subjective:  Medication Management   History of Present Illness: Carlos Patton is a 80 y.o. male with a history of seronegative rheumatoid arthritis. He is currently taking MTX 4 tablets weekly with with folic acid. He denies any side effects with the medication. He has some stiffness in the morning when he wakes up which improves with movement. He has not had any joint pain or swelling. He notes his nodules have improved since his last visit with no new nodules seen. 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 He denies any difficulty with completing the tasks. He denies enlarged lymph nodes, sores, or SOB. He rates his RA today as 2/10.   He is still having lower back pain. He notes the pain radiates into his legs, which is changed from the last visit. He denies tingling or numbness. He got an X-ray last visit which showed dextroscoliosis, severe multilevel spondylosis and facet joint arthropathy.  . He has not had a DEXA scan yet.   Activities of Daily Living:  Patient reports morning stiffness for 1 minute.   Patient Denies nocturnal pain.  Difficulty dressing/grooming: Denies Difficulty climbing stairs: Denies Difficulty getting out of chair: Denies Difficulty using hands for taps, buttons, cutlery, and/or writing: Denies  Review of Systems  Constitutional: Negative for fatigue.  HENT: Negative for mouth sores, mouth dryness and nose dryness.   Eyes: Positive for itching. Negative for pain and dryness.  Respiratory: Negative for shortness of breath and difficulty breathing.   Cardiovascular: Negative for chest pain and palpitations.  Gastrointestinal: Negative for blood in stool, constipation and diarrhea.  Endocrine: Negative for increased  urination.  Genitourinary: Negative for difficulty urinating.  Musculoskeletal: Positive for morning stiffness. Negative for arthralgias, joint pain, joint swelling, myalgias, muscle tenderness and myalgias.  Skin: Negative for color change, rash and redness.  Allergic/Immunologic: Negative for susceptible to infections.  Neurological: Negative for dizziness, numbness, headaches, memory loss and weakness.  Hematological: Positive for bruising/bleeding tendency.  Psychiatric/Behavioral: Negative for confusion and sleep disturbance.    PMFS History:  Patient Active Problem List   Diagnosis Date Noted  . Sepsis ([place]) 09/12/2018  . CAP (community acquired pneumonia) 09/12/2018  . Hypertension   . Rheumatoid arthritis of multiple sites with negative rheumatoid factor ([place]) 03/07/2018  . Rheumatoid nodules ([place]) 03/07/2018  . High risk medication use 03/07/2018  . History of asthma 03/07/2018  . History of hypertension 03/07/2018  . History of hypercholesterolemia 03/07/2018  . History of coronary artery disease 03/07/2018  . Benign prostatic hyperplasia with urinary obstruction 06/24/2016  . Elevated prostate specific antigen (PSA) 07/28/2011  . Coronary atherosclerosis 04/27/2010  . NONSPECIFIC ABNORMAL FINDING IN STOOL CONTENTS 04/27/2010  . PERSONAL HISTORY OF COLONIC POLYPS 04/27/2010    Past Medical History:  Diagnosis Date  . Allergy   . Arthritis   . Asthma    oc flare in the past   . CAP (community acquired pneumonia)   . Cataract   . Heart murmur   . History of colon polyps   . Hyperlipidemia   . Hypertension   . Myocardial infarction ([place])  2 MI--1980's, 1999  . Sepsis ([place])     Family History  Problem Relation Age of Onset  . Colon cancer Sister   . Arthritis Mother   . Stroke Father   . Heart attack Father   . Heart Problems Brother   . Heart Problems Brother   . Gout Son   . Colon polyps Neg Hx   . Esophageal cancer Neg Hx   . Rectal cancer Neg Hx    . Stomach cancer Neg Hx    Past Surgical History:  Procedure Laterality Date  . CARDIAC CATHETERIZATION    . CATARACT EXTRACTION Bilateral   . COLONOSCOPY    . CORONARY STENT PLACEMENT    . INGUINAL HERNIA REPAIR Bilateral   . POLYPECTOMY     Social History   Social History Narrative  . Not on file   Immunization History  Administered Date(s) Administered  . Moderna Sars-Covid-2 Vaccination 10/25/2019, 11/22/2019, 08/23/2020     Objective: Vital Signs: BP (!) 143/83 (BP Location: Left Arm, Patient Position: Sitting, Cuff Size: Normal)   Pulse 68   Resp 16   Ht 6\' 2"  (1.88 m)   Wt 215 lb (97.5 kg)   BMI 27.60 kg/m    Physical Exam Vitals and nursing note reviewed.  Constitutional:      Appearance: He is well-developed and well-nourished.  HENT:     Head: Normocephalic and atraumatic.  Eyes:     Extraocular Movements: EOM normal.     Conjunctiva/sclera: Conjunctivae normal.     Pupils: Pupils are equal, round, and reactive to light.  Cardiovascular:     Rate and Rhythm: Normal rate and regular rhythm.     Heart sounds: Normal heart sounds.  Pulmonary:     Effort: Pulmonary effort is normal.     Breath sounds: Normal breath sounds.  Abdominal:     General: Bowel sounds are normal.     Palpations: Abdomen is soft.  Musculoskeletal:     Cervical back: Normal range of motion and neck supple.  Skin:    General: Skin is warm and dry.     Capillary Refill: Capillary refill takes less than 2 seconds.     Comments: Rheumatoid nodules were noted on the left index and middle finger.  Neurological:     Mental Status: He is alert and oriented to person, place, and time.  Psychiatric:        Mood and Affect: Mood and affect normal.        Behavior: Behavior normal.      Musculoskeletal Exam: Good ROM of his c-spine, thoracic spine and lumbar spine. Good ROM of his shoulders, elbows and wrist bilaterally with no signs of synovitis..Thickening of 2nd,3rd, and 5th MCP  joint with no signs of inflammation. Berne prominence on his right. Nodules on 2nd and 3rd fingers on dorsal and volar aspects of his left hand. Good ROM of his hips, knees, ankles and feet bilaterally with no synovitis. No signs of effusion or warmth in his knees bilaterally. Bunion formation bilaterally. Subluxation of 1st MTP with no thickening or synovitis.   CDAI Exam: CDAI Score: 0  Patient Global: 0 mm; Provider Global: 0 mm Swollen: 0 ; Tender: 0  Joint Exam 11/20/2020   No joint exam has been documented for this visit   There is currently no information documented on the homunculus. Go to the Rheumatology activity and complete the homunculus joint exam.  Investigation: No additional findings.  Imaging: No results found.  Recent Labs: Lab Results  Component Value Date   WBC 5.8 02/02/2021   HGB 13.6 02/02/2021   PLT 165 02/02/2021   NA 142 02/02/2021   K 3.8 02/02/2021   CL 106 02/02/2021   CO2 29 02/02/2021   GLUCOSE 110 (H) 02/02/2021   BUN 13 02/02/2021   CREATININE 1.24 (H) 02/02/2021   BILITOT 0.9 02/02/2021   ALKPHOS 107 04/18/2019   AST 27 02/02/2021   ALT 15 02/02/2021   PROT 6.1 02/02/2021   ALBUMIN 4.2 04/18/2019   CALCIUM 10.0 02/02/2021   GFRAA 63 02/02/2021    Speciality Comments: No specialty comments available.  Procedures:  No procedures performed Allergies: Patient has no known allergies.   Assessment / Plan:     Visit Diagnoses: Rheumatoid arthritis of multiple sites with negative rheumatoid factor (HCC) - +CCP with nodulosis: Patient has been doing well on MTX 4 tabs weekly with no flares since the last visit. He has some stiffness but notes it improves with movement throughout the day. He has no difficulties completing his ADLs. Nodules were still seen on exam, but improving.   High risk medication use - MTX 4 tablets by mouth every week, folic acid 1 mg by mouth daily. - Plan: CBC with Differential/Platelet, COMPLETE METABOLIC PANEL WITH  GFR. Repeat CMP today. If Cr remains elevated we will cut down the medication to 3 tablets daily as he is clinically doing well..We also discussed receiving a 4th COVID booster and Shingrix vaccination.    Rheumatoid nodules ([place])- improving. Still seen on exam on his right hand.   Chronic midline low back pain without sciatica - X-ray showed severe dextroscoliosis, severe multilevel spondylosis and facet joint arthropathy.   I provided exercises to be completed to help with his back pain. He will call if his symptoms worsen.  He declined physical therapy.  History of coronary artery disease- Discussed the importance of keeping a heart healthy diet and exercising regularly. His BP was slightly elevated today at 143/83. I discussed the importance of completing a heart healthy diet with him.   History of hypercholesterolemia  History of asthma  History of hypertension-his blood pressure is mildly elevated today.  Have advised him to monitor blood pressure closely and follow-up with his PCP as needed.  Osteoporosis screening - based on his age he needs a DEXA scan.  He will discuss that with his PCP.  Orders: Orders Placed This Encounter  Procedures  . CBC with Differential/Platelet  . COMPLETE METABOLIC PANEL WITH GFR   No orders of the defined types were placed in this encounter.    Follow-Up Instructions: Return in about 5 months (around 04/22/2021) for Rheumatoid arthritis.   Bo Merino, MD  Note - This record has been created using Editor, commissioning.  Chart creation errors have been sought, but may not always  have been located. Such creation errors do not reflect on  the standard of medical care.

## 2020-11-20 ENCOUNTER — Ambulatory Visit: Payer: PPO | Admitting: Rheumatology

## 2020-11-20 ENCOUNTER — Other Ambulatory Visit: Payer: Self-pay

## 2020-11-20 ENCOUNTER — Encounter: Payer: Self-pay | Admitting: Rheumatology

## 2020-11-20 VITALS — BP 143/83 | HR 68 | Resp 16 | Ht 74.0 in | Wt 215.0 lb

## 2020-11-20 DIAGNOSIS — M545 Low back pain, unspecified: Secondary | ICD-10-CM

## 2020-11-20 DIAGNOSIS — Z13820 Encounter for screening for osteoporosis: Secondary | ICD-10-CM

## 2020-11-20 DIAGNOSIS — M0609 Rheumatoid arthritis without rheumatoid factor, multiple sites: Secondary | ICD-10-CM | POA: Diagnosis not present

## 2020-11-20 DIAGNOSIS — Z8709 Personal history of other diseases of the respiratory system: Secondary | ICD-10-CM | POA: Diagnosis not present

## 2020-11-20 DIAGNOSIS — Z8639 Personal history of other endocrine, nutritional and metabolic disease: Secondary | ICD-10-CM | POA: Diagnosis not present

## 2020-11-20 DIAGNOSIS — Z8679 Personal history of other diseases of the circulatory system: Secondary | ICD-10-CM | POA: Diagnosis not present

## 2020-11-20 DIAGNOSIS — G8929 Other chronic pain: Secondary | ICD-10-CM

## 2020-11-20 DIAGNOSIS — Z79899 Other long term (current) drug therapy: Secondary | ICD-10-CM

## 2020-11-20 DIAGNOSIS — M0630 Rheumatoid nodule, unspecified site: Secondary | ICD-10-CM

## 2020-11-20 NOTE — Patient Instructions (Signed)
Standing Labs We placed an order today for your standing lab work.   Please have your standing labs drawn in June and every 3 months  If possible, please have your labs drawn 2 weeks prior to your appointment so that the provider can discuss your results at your appointment.  We have open lab daily Monday through Thursday from 1:30-4:30 PM and Friday from 1:30-4:00 PM at the office of Dr. Bo Merino, [facility].   Please be advised, all patients with office appointments requiring lab work will take precedents over walk-in lab work.  If possible, please come for your lab work on Monday and Friday afternoons, as you may experience shorter wait times. The office is located at [address] [place], [place], [place] [address] No appointment is necessary.   Labs are drawn by [facility]. Please bring your co-pay at the time of your lab draw.  You may receive a bill from [facility] for your lab work.  If you wish to have your labs drawn at another location, please call the office 24 hours in advance to send orders.  If you have any questions regarding directions or hours of operation,  please call [phone].   As a reminder, please drink plenty of water prior to coming for your lab work. Thanks!  COVID-19 vaccine recommendations:   COVID-19 vaccine is recommended for everyone (unless you are allergic to a vaccine component), even if you are on a medication that suppresses your immune system.   If you are on Methotrexate, Cellcept (mycophenolate), Rinvoq, Morrie Sheldon, and Olumiant- hold the medication for 1 week after each vaccine. Hold Methotrexate for 2 weeks after the single dose COVID-19 vaccine.   The recommendations are for the individuals who are on immunosuppressive therapy to receive COVID-19 vaccine first 3 doses 1 month apart and then a fourth dose (booster) 6 months after the third dose.  Do not take Tylenol or any anti-inflammatory medications (NSAIDs) 24 hours prior  to the COVID-19 vaccination.   There is no direct evidence about the efficacy of the COVID-19 vaccine in individuals who are on medications that suppress the immune system.   Even if you are fully vaccinated, and you are on any medications that suppress your immune system, please continue to wear a mask, maintain at least six feet social distance and practice hand hygiene.   If you develop a COVID-19 infection, please contact your PCP or our office to determine if you need monoclonal antibody infusion.  The booster vaccine is now available for immunocompromised patients.   Please see the following web sites for updated information.   https://[email or web address]-Vaccination-Patient-Resources.pdf  Heart Disease Prevention   Your inflammatory disease increases your risk of heart disease which includes heart attack, stroke, atrial fibrillation (irregular heartbeats), high blood pressure, heart failure and atherosclerosis (plaque in the arteries).  It is important to reduce your risk by:   . Keep blood pressure, cholesterol, and blood sugar at healthy levels   . Smoking Cessation   . Maintain a healthy weight  o BMI 20-25   . Eat a healthy diet  o Plenty of fresh fruit, vegetables, and whole grains  o Limit saturated fats, foods high in sodium, and added sugars  o DASH and Mediterranean diet   . Increase physical activity  o Recommend moderate physically activity for 150 minutes per week/ 30 minutes a day for five days a week These can be broken up into three separate ten-minute sessions during the day.   . Reduce  Stress  . Meditation, slow breathing exercises, yoga, coloring books  . Dental visits twice a year

## 2020-11-21 LAB — CBC WITH DIFFERENTIAL/PLATELET
Absolute Monocytes: 510 cells/uL (ref 200–950)
Basophils Absolute: 42 cells/uL (ref 0–200)
Basophils Relative: 0.8 %
Eosinophils Absolute: 260 cells/uL (ref 15–500)
Eosinophils Relative: 5 %
HCT: 41.7 % (ref 38.5–50.0)
Hemoglobin: 13.9 g/dL (ref 13.2–17.1)
Lymphs Abs: 931 cells/uL (ref 850–3900)
MCH: 31.6 pg (ref 27.0–33.0)
MCHC: 33.3 g/dL (ref 32.0–36.0)
MCV: 94.8 fL (ref 80.0–100.0)
MPV: 11.1 fL (ref 7.5–12.5)
Monocytes Relative: 9.8 %
Neutro Abs: 3458 cells/uL (ref 1500–7800)
Neutrophils Relative %: 66.5 %
Platelets: 183 10*3/uL (ref 140–400)
RBC: 4.4 10*6/uL (ref 4.20–5.80)
RDW: 13.2 % (ref 11.0–15.0)
Total Lymphocyte: 17.9 %
WBC: 5.2 10*3/uL (ref 3.8–10.8)

## 2020-11-21 LAB — COMPLETE METABOLIC PANEL WITH GFR
AG Ratio: 1.9 (calc) (ref 1.0–2.5)
ALT: 16 U/L (ref 9–46)
AST: 31 U/L (ref 10–35)
Albumin: 4.1 g/dL (ref 3.6–5.1)
Alkaline phosphatase (APISO): 87 U/L (ref 35–144)
BUN: 18 mg/dL (ref 7–25)
CO2: 28 mmol/L (ref 20–32)
Calcium: 9.6 mg/dL (ref 8.6–10.3)
Chloride: 107 mmol/L (ref 98–110)
Creat: 1.07 mg/dL (ref 0.70–1.11)
GFR, Est African American: 76 mL/min/{1.73_m2} (ref >=60)
GFR, Est Non African American: 65 mL/min/{1.73_m2} (ref >=60)
Globulin: 2.2 g/dL (calc) (ref 1.9–3.7)
Glucose, Bld: 86 mg/dL (ref 65–99)
Potassium: 4.3 mmol/L (ref 3.5–5.3)
Sodium: 141 mmol/L (ref 135–146)
Total Bilirubin: 0.6 mg/dL (ref 0.2–1.2)
Total Protein: 6.3 g/dL (ref 6.1–8.1)

## 2020-11-21 NOTE — Progress Notes (Signed)
CBC and CMP normal

## 2020-11-26 DIAGNOSIS — Z1152 Encounter for screening for COVID-19: Secondary | ICD-10-CM | POA: Diagnosis not present

## 2020-11-26 DIAGNOSIS — R058 Other specified cough: Secondary | ICD-10-CM | POA: Diagnosis not present

## 2020-11-26 DIAGNOSIS — J069 Acute upper respiratory infection, unspecified: Secondary | ICD-10-CM | POA: Diagnosis not present

## 2020-12-03 DIAGNOSIS — H26493 Other secondary cataract, bilateral: Secondary | ICD-10-CM | POA: Diagnosis not present

## 2020-12-03 DIAGNOSIS — H02403 Unspecified ptosis of bilateral eyelids: Secondary | ICD-10-CM | POA: Diagnosis not present

## 2020-12-03 DIAGNOSIS — H353132 Nonexudative age-related macular degeneration, bilateral, intermediate dry stage: Secondary | ICD-10-CM | POA: Diagnosis not present

## 2020-12-03 DIAGNOSIS — D3132 Benign neoplasm of left choroid: Secondary | ICD-10-CM | POA: Diagnosis not present

## 2020-12-21 ENCOUNTER — Other Ambulatory Visit: Payer: Self-pay | Admitting: *Deleted

## 2020-12-21 MED ORDER — METHOTREXATE SODIUM 2.5 MG PO TABS: ORAL_TABLET | ORAL | 0 refills | Status: DC

## 2020-12-21 NOTE — Telephone Encounter (Signed)
RF faxed from [facility]  Next Visit: 04/30/2021  Last Visit: 11/20/2020  Last Fill: 10/19/2020  DX:  Rheumatoid arthritis of multiple sites with negative rheumatoid factor   Current Dose per office note 11/20/2020, MTX 4 tablets by mouth every week  Labs: 11/20/2020, CBC and CMP normal.  Okay to refill MTX?

## 2020-12-29 DIAGNOSIS — H26491 Other secondary cataract, right eye: Secondary | ICD-10-CM | POA: Diagnosis not present

## 2021-02-02 ENCOUNTER — Other Ambulatory Visit: Payer: Self-pay

## 2021-02-02 DIAGNOSIS — Z79899 Other long term (current) drug therapy: Secondary | ICD-10-CM

## 2021-02-03 ENCOUNTER — Telehealth: Payer: Self-pay | Admitting: Rheumatology

## 2021-02-03 ENCOUNTER — Other Ambulatory Visit: Payer: Self-pay | Admitting: *Deleted

## 2021-02-03 LAB — CBC WITH DIFFERENTIAL/PLATELET
Absolute Monocytes: 516 cells/uL (ref 200–950)
Basophils Absolute: 41 cells/uL (ref 0–200)
Basophils Relative: 0.7 %
Eosinophils Absolute: 244 cells/uL (ref 15–500)
Eosinophils Relative: 4.2 %
HCT: 40.2 % (ref 38.5–50.0)
Hemoglobin: 13.6 g/dL (ref 13.2–17.1)
Lymphs Abs: 1009 cells/uL (ref 850–3900)
MCH: 33.2 pg — ABNORMAL HIGH (ref 27.0–33.0)
MCHC: 33.8 g/dL (ref 32.0–36.0)
MCV: 98 fL (ref 80.0–100.0)
MPV: 11.3 fL (ref 7.5–12.5)
Monocytes Relative: 8.9 %
Neutro Abs: 3990 cells/uL (ref 1500–7800)
Neutrophils Relative %: 68.8 %
Platelets: 165 10*3/uL (ref 140–400)
RBC: 4.1 10*6/uL — ABNORMAL LOW (ref 4.20–5.80)
RDW: 12.8 % (ref 11.0–15.0)
Total Lymphocyte: 17.4 %
WBC: 5.8 10*3/uL (ref 3.8–10.8)

## 2021-02-03 LAB — COMPLETE METABOLIC PANEL WITH GFR
AG Ratio: 1.9 (calc) (ref 1.0–2.5)
ALT: 15 U/L (ref 9–46)
AST: 27 U/L (ref 10–35)
Albumin: 4 g/dL (ref 3.6–5.1)
Alkaline phosphatase (APISO): 83 U/L (ref 35–144)
BUN/Creatinine Ratio: 10 (calc) (ref 6–22)
BUN: 13 mg/dL (ref 7–25)
CO2: 29 mmol/L (ref 20–32)
Calcium: 10 mg/dL (ref 8.6–10.3)
Chloride: 106 mmol/L (ref 98–110)
Creat: 1.24 mg/dL — ABNORMAL HIGH (ref 0.70–1.11)
GFR, Est African American: 63 mL/min/{1.73_m2} (ref >=60)
GFR, Est Non African American: 55 mL/min/{1.73_m2} — ABNORMAL LOW (ref >=60)
Globulin: 2.1 g/dL (calc) (ref 1.9–3.7)
Glucose, Bld: 110 mg/dL — ABNORMAL HIGH (ref 65–99)
Potassium: 3.8 mmol/L (ref 3.5–5.3)
Sodium: 142 mmol/L (ref 135–146)
Total Bilirubin: 0.9 mg/dL (ref 0.2–1.2)
Total Protein: 6.1 g/dL (ref 6.1–8.1)

## 2021-02-03 NOTE — Telephone Encounter (Signed)
LMOM for patient to call office to call office to discuss lab results.

## 2021-02-03 NOTE — Progress Notes (Signed)
CBC is normal.  Glucose is mildly elevated, probably not a fasting sample.  Creatinine is mildly elevated.  Patient is clinically doing well.  Please advise him to reduce methotrexate to 3 tablets p.o. weekly.  We will continue to monitor labs as scheduled.

## 2021-02-03 NOTE — Telephone Encounter (Signed)
Patient left a message stating he was returning your call for lab results. You can reach him at [phone], or [phone].

## 2021-02-05 MED ORDER — METHOTREXATE SODIUM 2.5 MG PO TABS: ORAL_TABLET | ORAL | 0 refills | Status: DC

## 2021-04-16 NOTE — Progress Notes (Signed)
Office Visit Note  Patient: Carlos Patton             Date of Birth: [birth year]           MRN: [id number]             PCP: Velna Hatchet, MD Referring: Velna Hatchet, MD Visit Date: 04/30/2021 Occupation: '@GUAROCC'$ @  Subjective:  Mediation monitoring   History of Present Illness: Carlos Patton is a 80 y.o. male with history of seronegative rheumatoid arthritis.  He is taking methotrexate 3 tablets by mouth once weekly and folic acid Q000111Q mcg daily.  He denies missing any doses of methotrexate recently.  He continues to tolerate methotrexate without any side effects.  He denies any recent rheumatoid arthritis flares.  He states that he has occasional pain and stiffness in both knee joints.  He has not had any recent falls.  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  He has occasional pain and stiffness in his hands but denies any joint swelling at this time.  His discomfort is typically self resolving within a day or so.  He has not had any nocturnal pain.  He has occasional episodes of right-sided sciatica but is not having any lower back pain at this time.  He denies any muscle spasms. He denies any recent infections.   Activities of Daily Living:  Patient reports morning stiffness for 10-15 minutes.   Patient Denies nocturnal pain.  Difficulty dressing/grooming: Denies Difficulty climbing stairs: Denies Difficulty getting out of chair: Denies Difficulty using hands for taps, buttons, cutlery, and/or writing: Denies  Review of Systems  Constitutional:  Negative for fatigue.  HENT:  Negative for mouth sores, mouth dryness and nose dryness.   Eyes:  Negative for pain, itching and dryness.  Respiratory:  Negative for shortness of breath and difficulty breathing.   Cardiovascular:  Negative for chest pain and palpitations.  Gastrointestinal:  Negative for blood in stool, constipation and diarrhea.  Endocrine: Negative for increased urination.  Genitourinary:  Negative for  difficulty urinating.  Musculoskeletal:  Positive for joint pain, joint pain and morning stiffness. Negative for joint swelling, myalgias, muscle tenderness and myalgias.  Skin:  Negative for color change, rash and redness.  Allergic/Immunologic: Negative for susceptible to infections.  Neurological:  Negative for dizziness, numbness, headaches, memory loss and weakness.  Hematological:  Positive for bruising/bleeding tendency.  Psychiatric/Behavioral:  Negative for confusion.    PMFS History:  Patient Active Problem List   Diagnosis Date Noted   Sepsis ([place]) 09/12/2018   CAP (community acquired pneumonia) 09/12/2018   Hypertension    Rheumatoid arthritis of multiple sites with negative rheumatoid factor ([place]) 03/07/2018   Rheumatoid nodules ([place]) 03/07/2018   High risk medication use 03/07/2018   History of asthma 03/07/2018   History of hypertension 03/07/2018   History of hypercholesterolemia 03/07/2018   History of coronary artery disease 03/07/2018   Benign prostatic hyperplasia with urinary obstruction 06/24/2016   Elevated prostate specific antigen (PSA) 07/28/2011   Coronary atherosclerosis 04/27/2010   NONSPECIFIC ABNORMAL FINDING IN STOOL CONTENTS 04/27/2010   PERSONAL HISTORY OF COLONIC POLYPS 04/27/2010    Past Medical History:  Diagnosis Date   Allergy    Arthritis    Asthma    oc flare in the past    CAP (community acquired pneumonia)    Cataract    Heart murmur    History of colon polyps    Hyperlipidemia    Hypertension    Myocardial  infarction ([place])    2 MI--1980's, 1999   Sepsis ([place])     Family History  Problem Relation Age of Onset   Colon cancer Sister    Arthritis Mother    Stroke Father    Heart attack Father    Heart Problems Brother    Heart Problems Brother    Gout Son    Colon polyps Neg Hx    Esophageal cancer Neg Hx    Rectal cancer Neg Hx    Stomach cancer Neg Hx    Past Surgical History:  Procedure Laterality Date   CARDIAC  CATHETERIZATION     CATARACT EXTRACTION Bilateral    COLONOSCOPY     CORONARY STENT PLACEMENT     INGUINAL HERNIA REPAIR Bilateral    POLYPECTOMY     Social History   Social History Narrative   Not on file   Immunization History  Administered Date(s) Administered   Moderna Sars-Covid-2 Vaccination 10/25/2019, 11/22/2019, 08/23/2020     Objective: Vital Signs: BP 128/81 (BP Location: Left Arm, Patient Position: Sitting, Cuff Size: Normal)   Pulse 64   Resp 16   Ht '6\' 2"'$  (1.88 m)   Wt 204 lb 12.8 oz (92.9 kg)   BMI 26.29 kg/m    Physical Exam Vitals and nursing note reviewed.  Constitutional:      Appearance: He is well-developed.  HENT:     Head: Normocephalic and atraumatic.  Eyes:     Conjunctiva/sclera: Conjunctivae normal.     Pupils: Pupils are equal, round, and reactive to light.  Pulmonary:     Effort: Pulmonary effort is normal.  Abdominal:     Palpations: Abdomen is soft.  Musculoskeletal:     Cervical back: Normal range of motion and neck supple.  Skin:    General: Skin is warm and dry.     Capillary Refill: Capillary refill takes less than 2 seconds.  Neurological:     Mental Status: He is alert and oriented to person, place, and time.  Psychiatric:        Behavior: Behavior normal.     Musculoskeletal Exam: C-spine, thoracic spine, lumbar spine good range of motion with no discomfort.  Shoulder joints and elbow joints have good range of motion with no discomfort.  Limited and flexion extension of the right wrist.  Synovial thickening over the right wrist joint.  Right CMC joint prominence.  Thickening of the second, third, and fifth MCP joints bilaterally.  No tenderness or synovitis over MCP joints noted.  Nodules on the left second and third fingers on the dorsal and volar aspects.  Hip joints have good range of motion with no discomfort.  Knee joints have good range of motion with no warmth or effusion.  Ankle joints have good range of motion with no  tenderness or joint swelling.  CDAI Exam: CDAI Score: 1  Patient Global: 5 mm; Provider Global: 5 mm Swollen: 0 ; Tender: 0  Joint Exam 04/30/2021   No joint exam has been documented for this visit   There is currently no information documented on the homunculus. Go to the Rheumatology activity and complete the homunculus joint exam.  Investigation: No additional findings.  Imaging: No results found.  Recent Labs: Lab Results  Component Value Date   WBC 5.8 02/02/2021   HGB 13.6 02/02/2021   PLT 165 02/02/2021   NA 142 02/02/2021   K 3.8 02/02/2021   CL 106 02/02/2021   CO2 29 02/02/2021   GLUCOSE  110 (H) 02/02/2021   BUN 13 02/02/2021   CREATININE 1.24 (H) 02/02/2021   BILITOT 0.9 02/02/2021   ALKPHOS 107 04/18/2019   AST 27 02/02/2021   ALT 15 02/02/2021   PROT 6.1 02/02/2021   ALBUMIN 4.2 04/18/2019   CALCIUM 10.0 02/02/2021   GFRAA 63 02/02/2021    Speciality Comments: No specialty comments available.  Procedures:  No procedures performed Allergies: Patient has no known allergies.   Assessment / Plan:     Visit Diagnoses: Rheumatoid arthritis of multiple sites with negative rheumatoid factor (HCC) - +CCP with nodulosis: He has no joint tenderness or synovitis on examination today.  He has clinically been doing well taking methotrexate 3 tablets by mouth once weekly and folic acid Q000111Q mcg daily.  He continues to tolerate methotrexate without any side effects and has not missed any doses recently.  He has occasional pain and stiffness in both knee joints but had no warmth or effusion on examination today.  He has occasional arthralgias in both hands and his lower back but his discomfort is typically self resolving within a day.  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  He has no new concerns or questions at this time.  He has not been diagnosed with any new medical conditions.  He will remain on methotrexate 3 tablets by mouth once weekly and folic acid Q000111Q mcg  daily.  He was advised to notify us if he develops increased joint pain or joint swelling.  He will follow-up in the office in 5 months.  High risk medication use - Methotrexate 3 tablets by mouth every week, folic acid Q000111Q mcg by mouth daily.  CBC and CMP were drawn on 02/02/2021.  His creatinine was mildly elevated 1.24 and GFR was 55 at which time he was advised to reduce the dose of methotrexate from 4 tablets to 3 tablets weekly.  We will recheck CBC and CMP today.  His next lab work will be due in November and every 3 months to monitor for drug toxicity.- Plan: CBC with Differential/Platelet, COMPLETE METABOLIC PANEL WITH GFR He has not had any recent infections.  We discussed the importance of holding methotrexate if he develops signs or symptoms of an infection and to resume once the infection is completely cleared. Discussed the importance of receiving the second COVID-19 vaccine booster this fall as well as the annual influenza vaccine.  Rheumatoid nodules ([place]): Unchanged.  Rheumatoid nodules noted on the left index and middle finger.  Other medical conditions are listed as follows:   History of coronary artery disease  History of hypercholesterolemia  History of asthma  History of hypertension  Orders: Orders Placed This Encounter  Procedures   CBC with Differential/Platelet   COMPLETE METABOLIC PANEL WITH GFR   Meds ordered this encounter  Medications   methotrexate 2.5 MG tablet    Sig: Take 3 tablets by mouth once a week (Caution: chemotherapy. Protect from light)    Dispense:  36 tablet    Refill:  0     Follow-Up Instructions: Return in about 5 months (around 09/30/2021) for Rheumatoid arthritis.   Ofilia Neas, PA-C  Note - This record has been created using Dragon software.  Chart creation errors have been sought, but may not always  have been located. Such creation errors do not reflect on  the standard of medical care.

## 2021-04-30 ENCOUNTER — Ambulatory Visit: Payer: PPO | Admitting: Physician Assistant

## 2021-04-30 ENCOUNTER — Other Ambulatory Visit: Payer: Self-pay

## 2021-04-30 ENCOUNTER — Encounter: Payer: Self-pay | Admitting: Physician Assistant

## 2021-04-30 VITALS — BP 128/81 | HR 64 | Resp 16 | Ht 74.0 in | Wt 204.8 lb

## 2021-04-30 DIAGNOSIS — M0609 Rheumatoid arthritis without rheumatoid factor, multiple sites: Secondary | ICD-10-CM

## 2021-04-30 DIAGNOSIS — Z8679 Personal history of other diseases of the circulatory system: Secondary | ICD-10-CM

## 2021-04-30 DIAGNOSIS — Z8639 Personal history of other endocrine, nutritional and metabolic disease: Secondary | ICD-10-CM | POA: Diagnosis not present

## 2021-04-30 DIAGNOSIS — M0630 Rheumatoid nodule, unspecified site: Secondary | ICD-10-CM

## 2021-04-30 DIAGNOSIS — Z8709 Personal history of other diseases of the respiratory system: Secondary | ICD-10-CM | POA: Diagnosis not present

## 2021-04-30 DIAGNOSIS — Z79899 Other long term (current) drug therapy: Secondary | ICD-10-CM | POA: Diagnosis not present

## 2021-04-30 MED ORDER — METHOTREXATE SODIUM 2.5 MG PO TABS: ORAL_TABLET | ORAL | 0 refills | Status: DC

## 2021-04-30 NOTE — Patient Instructions (Signed)

## 2021-05-01 LAB — COMPLETE METABOLIC PANEL WITH GFR
AG Ratio: 1.7 (calc) (ref 1.0–2.5)
ALT: 13 U/L (ref 9–46)
AST: 24 U/L (ref 10–35)
Albumin: 3.8 g/dL (ref 3.6–5.1)
Alkaline phosphatase (APISO): 101 U/L (ref 35–144)
BUN: 18 mg/dL (ref 7–25)
CO2: 30 mmol/L (ref 20–32)
Calcium: 9.4 mg/dL (ref 8.6–10.3)
Chloride: 106 mmol/L (ref 98–110)
Creat: 1.13 mg/dL (ref 0.70–1.22)
Globulin: 2.2 g/dL (calc) (ref 1.9–3.7)
Glucose, Bld: 70 mg/dL (ref 65–99)
Potassium: 4.3 mmol/L (ref 3.5–5.3)
Sodium: 140 mmol/L (ref 135–146)
Total Bilirubin: 0.8 mg/dL (ref 0.2–1.2)
Total Protein: 6 g/dL — ABNORMAL LOW (ref 6.1–8.1)
eGFR: 66 mL/min/{1.73_m2} (ref >=60)

## 2021-05-01 LAB — CBC WITH DIFFERENTIAL/PLATELET
Absolute Monocytes: 419 cells/uL (ref 200–950)
Basophils Absolute: 18 cells/uL (ref 0–200)
Basophils Relative: 0.4 %
Eosinophils Absolute: 170 cells/uL (ref 15–500)
Eosinophils Relative: 3.7 %
HCT: 40.9 % (ref 38.5–50.0)
Hemoglobin: 13.9 g/dL (ref 13.2–17.1)
Lymphs Abs: 851 cells/uL (ref 850–3900)
MCH: 33.3 pg — ABNORMAL HIGH (ref 27.0–33.0)
MCHC: 34 g/dL (ref 32.0–36.0)
MCV: 97.8 fL (ref 80.0–100.0)
MPV: 11.2 fL (ref 7.5–12.5)
Monocytes Relative: 9.1 %
Neutro Abs: 3142 cells/uL (ref 1500–7800)
Neutrophils Relative %: 68.3 %
Platelets: 175 10*3/uL (ref 140–400)
RBC: 4.18 10*6/uL — ABNORMAL LOW (ref 4.20–5.80)
RDW: 12.8 % (ref 11.0–15.0)
Total Lymphocyte: 18.5 %
WBC: 4.6 10*3/uL (ref 3.8–10.8)

## 2021-05-03 NOTE — Progress Notes (Signed)
CBC and CMP are stable.

## 2021-07-02 DIAGNOSIS — S8012XA Contusion of left lower leg, initial encounter: Secondary | ICD-10-CM | POA: Diagnosis not present

## 2021-07-05 ENCOUNTER — Other Ambulatory Visit: Payer: Self-pay

## 2021-07-05 DIAGNOSIS — Z79899 Other long term (current) drug therapy: Secondary | ICD-10-CM

## 2021-07-06 LAB — CBC WITH DIFFERENTIAL/PLATELET
Absolute Monocytes: 417 cells/uL (ref 200–950)
Basophils Absolute: 20 cells/uL (ref 0–200)
Basophils Relative: 0.5 %
Eosinophils Absolute: 109 cells/uL (ref 15–500)
Eosinophils Relative: 2.8 %
HCT: 39.5 % (ref 38.5–50.0)
Hemoglobin: 12.8 g/dL — ABNORMAL LOW (ref 13.2–17.1)
Lymphs Abs: 1377 cells/uL (ref 850–3900)
MCH: 31.4 pg (ref 27.0–33.0)
MCHC: 32.4 g/dL (ref 32.0–36.0)
MCV: 96.8 fL (ref 80.0–100.0)
MPV: 11.7 fL (ref 7.5–12.5)
Monocytes Relative: 10.7 %
Neutro Abs: 1977 cells/uL (ref 1500–7800)
Neutrophils Relative %: 50.7 %
Platelets: 139 10*3/uL — ABNORMAL LOW (ref 140–400)
RBC: 4.08 10*6/uL — ABNORMAL LOW (ref 4.20–5.80)
RDW: 12.7 % (ref 11.0–15.0)
Total Lymphocyte: 35.3 %
WBC: 3.9 10*3/uL (ref 3.8–10.8)

## 2021-07-06 LAB — COMPLETE METABOLIC PANEL WITH GFR
AG Ratio: 1.6 (calc) (ref 1.0–2.5)
ALT: 16 U/L (ref 9–46)
AST: 29 U/L (ref 10–35)
Albumin: 3.9 g/dL (ref 3.6–5.1)
Alkaline phosphatase (APISO): 91 U/L (ref 35–144)
BUN: 19 mg/dL (ref 7–25)
CO2: 29 mmol/L (ref 20–32)
Calcium: 9.8 mg/dL (ref 8.6–10.3)
Chloride: 105 mmol/L (ref 98–110)
Creat: 1.2 mg/dL (ref 0.70–1.22)
Globulin: 2.4 g/dL (calc) (ref 1.9–3.7)
Glucose, Bld: 117 mg/dL — ABNORMAL HIGH (ref 65–99)
Potassium: 3.7 mmol/L (ref 3.5–5.3)
Sodium: 141 mmol/L (ref 135–146)
Total Bilirubin: 0.7 mg/dL (ref 0.2–1.2)
Total Protein: 6.3 g/dL (ref 6.1–8.1)
eGFR: 61 mL/min/{1.73_m2} (ref >=60)

## 2021-07-06 NOTE — Progress Notes (Signed)
Decreasing hemoglobin and platelets noted.  Glucose is mildly elevated, probably not a fasting sample.  Please have patient repeat CBC in 2 weeks.

## 2021-07-07 ENCOUNTER — Telehealth: Payer: Self-pay | Admitting: *Deleted

## 2021-07-07 DIAGNOSIS — Z79899 Other long term (current) drug therapy: Secondary | ICD-10-CM

## 2021-07-07 NOTE — Telephone Encounter (Signed)
-----   Message from Bo Merino, MD sent at 07/06/2021 12:16 PM EDT ----- Decreasing hemoglobin and platelets noted.  Glucose is mildly elevated, probably not a fasting sample.  Please have patient repeat CBC in 2 weeks.

## 2021-07-08 ENCOUNTER — Other Ambulatory Visit: Payer: Self-pay | Admitting: *Deleted

## 2021-07-08 MED ORDER — METHOTREXATE SODIUM 2.5 MG PO TABS: ORAL_TABLET | ORAL | 0 refills | Status: DC

## 2021-07-08 NOTE — Telephone Encounter (Signed)
Refill request received via fax  Next Visit: 10/01/2020  Last Visit: 04/30/2021  Last Fill: 04/30/2021  DX: Rheumatoid arthritis of multiple sites with negative rheumatoid factor   Current Dose per office note 04/30/2021: Methotrexate 3 tablets by mouth every week  Labs: 07/05/2021 Decreasing hemoglobin and platelets noted.  Glucose is mildly elevated, probably not a fasting sample.   Okay to refill MTX?

## 2021-07-21 ENCOUNTER — Other Ambulatory Visit: Payer: Self-pay | Admitting: *Deleted

## 2021-07-21 DIAGNOSIS — Z79899 Other long term (current) drug therapy: Secondary | ICD-10-CM

## 2021-07-22 LAB — CBC WITH DIFFERENTIAL/PLATELET
Absolute Monocytes: 572 cells/uL (ref 200–950)
Basophils Absolute: 28 cells/uL (ref 0–200)
Basophils Relative: 0.5 %
Eosinophils Absolute: 182 cells/uL (ref 15–500)
Eosinophils Relative: 3.3 %
HCT: 38.8 % (ref 38.5–50.0)
Hemoglobin: 13 g/dL — ABNORMAL LOW (ref 13.2–17.1)
Lymphs Abs: 1326 cells/uL (ref 850–3900)
MCH: 32.7 pg (ref 27.0–33.0)
MCHC: 33.5 g/dL (ref 32.0–36.0)
MCV: 97.7 fL (ref 80.0–100.0)
MPV: 11.7 fL (ref 7.5–12.5)
Monocytes Relative: 10.4 %
Neutro Abs: 3394 cells/uL (ref 1500–7800)
Neutrophils Relative %: 61.7 %
Platelets: 162 10*3/uL (ref 140–400)
RBC: 3.97 10*6/uL — ABNORMAL LOW (ref 4.20–5.80)
RDW: 12.4 % (ref 11.0–15.0)
Total Lymphocyte: 24.1 %
WBC: 5.5 10*3/uL (ref 3.8–10.8)

## 2021-07-22 NOTE — Progress Notes (Signed)
Mild anemia noted which is improved.  Platelets are normal now.

## 2021-08-20 DIAGNOSIS — H353132 Nonexudative age-related macular degeneration, bilateral, intermediate dry stage: Secondary | ICD-10-CM | POA: Diagnosis not present

## 2021-08-20 DIAGNOSIS — Z961 Presence of intraocular lens: Secondary | ICD-10-CM | POA: Diagnosis not present

## 2021-09-17 NOTE — Progress Notes (Signed)
Office Visit Note  Patient: Carlos Patton             Date of Birth: Sep 12, 1941           MRN: [id number]             PCP: Velna Hatchet, MD Referring: Velna Hatchet, MD Visit Date: 10/01/2021 Occupation: @GUAROCC @  Subjective:  Medication monitoring   History of Present Illness: Carlos Patton is a 80 y.o. male with history of seronegative rheumatoid arthritis.  He is taking methotrexate 3 tablets by mouth once weekly and folic acid 185 mcg daily.  He continues to tolerate methotrexate without any side effects and has not missed any doses recently.  He denies any signs or symptoms of a rheumatoid arthritis flare.  He has occasional pain and stiffness in both hands due to underlying osteoarthritis but has not noticed any inflammation.  The rheumatoid nodules on his hands are unchanged.  He has occasional discomfort in his hip joints if he rides in the car for prolonged periods of time.  He has occasional arthralgias and joint stiffness which she attributes to his low activity at his age.  He denies any new or worsening concerns.  He has not been diagnosed with any new medical conditions since his last office visit.  He denies any recent infections.    Activities of Daily Living:  Patient reports morning stiffness for a few minutes.   Patient Denies nocturnal pain.  Difficulty dressing/grooming: Denies Difficulty climbing stairs: Denies Difficulty getting out of chair: Denies Difficulty using hands for taps, buttons, cutlery, and/or writing: Denies  Review of Systems  Constitutional:  Positive for fatigue.  HENT:  Negative for mouth sores, mouth dryness and nose dryness.   Eyes:  Positive for itching. Negative for pain and dryness.  Respiratory:  Negative for shortness of breath and difficulty breathing.   Cardiovascular:  Negative for chest pain and palpitations.  Gastrointestinal:  Negative for blood in stool, constipation and diarrhea.  Endocrine: Negative for  increased urination.  Genitourinary:  Negative for difficulty urinating.  Musculoskeletal:  Positive for morning stiffness. Negative for joint pain, joint pain, joint swelling, myalgias, muscle tenderness and myalgias.  Skin:  Negative for color change, rash and redness.  Allergic/Immunologic: Negative for susceptible to infections.  Neurological:  Negative for dizziness, numbness, headaches, memory loss and weakness.  Hematological:  Positive for bruising/bleeding tendency.  Psychiatric/Behavioral:  Negative for confusion.    PMFS History:  Patient Active Problem List   Diagnosis Date Noted   Sepsis ([place]) 09/12/2018   CAP (community acquired pneumonia) 09/12/2018   Hypertension    Rheumatoid arthritis of multiple sites with negative rheumatoid factor ([place]) 03/07/2018   Rheumatoid nodules ([place]) 03/07/2018   High risk medication use 03/07/2018   History of asthma 03/07/2018   History of hypertension 03/07/2018   History of hypercholesterolemia 03/07/2018   History of coronary artery disease 03/07/2018   Benign prostatic hyperplasia with urinary obstruction 06/24/2016   Elevated prostate specific antigen (PSA) 07/28/2011   Coronary atherosclerosis 04/27/2010   NONSPECIFIC ABNORMAL FINDING IN STOOL CONTENTS 04/27/2010   PERSONAL HISTORY OF COLONIC POLYPS 04/27/2010    Past Medical History:  Diagnosis Date   Allergy    Arthritis    Asthma    oc flare in the past    CAP (community acquired pneumonia)    Cataract    Heart murmur    History of colon polyps    Hyperlipidemia    Hypertension  Myocardial infarction ([place])    2 MI--1980's, 1999   Sepsis ([place])     Family History  Problem Relation Age of Onset   Colon cancer Sister    Arthritis Mother    Stroke Father    Heart attack Father    Heart Problems Brother    Heart Problems Brother    Gout Son    Colon polyps Neg Hx    Esophageal cancer Neg Hx    Rectal cancer Neg Hx    Stomach cancer Neg Hx    Past Surgical  History:  Procedure Laterality Date   CARDIAC CATHETERIZATION     CATARACT EXTRACTION Bilateral    COLONOSCOPY     CORONARY STENT PLACEMENT     INGUINAL HERNIA REPAIR Bilateral    POLYPECTOMY     Social History   Social History Narrative   Not on file   Immunization History  Administered Date(s) Administered   Moderna Sars-Covid-2 Vaccination 10/25/2019, 11/22/2019, 08/23/2020     Objective: Vital Signs: BP 135/77 (BP Location: Left Arm, Patient Position: Sitting, Cuff Size: Normal)    Pulse 61    Ht 6\' 2"  (1.88 m)    Wt 206 lb (93.4 kg)    BMI 26.45 kg/m    Physical Exam Vitals and nursing note reviewed.  Constitutional:      Appearance: He is well-developed.  HENT:     Head: Normocephalic and atraumatic.  Eyes:     Conjunctiva/sclera: Conjunctivae normal.     Pupils: Pupils are equal, round, and reactive to light.  Pulmonary:     Effort: Pulmonary effort is normal.  Abdominal:     Palpations: Abdomen is soft.  Musculoskeletal:     Cervical back: Normal range of motion and neck supple.  Skin:    General: Skin is warm and dry.     Capillary Refill: Capillary refill takes less than 2 seconds.  Neurological:     Mental Status: He is alert and oriented to person, place, and time.  Psychiatric:        Behavior: Behavior normal.     Musculoskeletal Exam: C-spine is slightly limited range of motion with lateral rotation.  No midline spinal tenderness or SI joint tenderness.  Shoulder joints have good range of motion with some discomfort with full abduction.  Limited flexion extension of the right wrist joint with some synovial thickening.  Right CMC joint prominence noted.  PIP and DIP thickening consistent with osteoarthritis of both hands.  Thickening of the second third and fifth MCP joints of both hands.  No tenderness or synovitis over MCP or PIP joints.  Nodules palpable on the left index and middle finger.  Hip joints have good range of motion with no groin pain.  Knee  joints have good range of motion with some fullness in the right knee.  Ankle joints have good range of motion with no tenderness or joint swelling.  CDAI Exam: CDAI Score: 0.8  Patient Global: 4 mm; Provider Global: 4 mm Swollen: 0 ; Tender: 0  Joint Exam 10/01/2021   No joint exam has been documented for this visit   There is currently no information documented on the homunculus. Go to the Rheumatology activity and complete the homunculus joint exam.  Investigation: No additional findings.  Imaging: No results found.  Recent Labs: Lab Results  Component Value Date   WBC 5.5 07/21/2021   HGB 13.0 (L) 07/21/2021   PLT 162 07/21/2021   NA 141 07/05/2021  K 3.7 07/05/2021   CL 105 07/05/2021   CO2 29 07/05/2021   GLUCOSE 117 (H) 07/05/2021   BUN 19 07/05/2021   CREATININE 1.20 07/05/2021   BILITOT 0.7 07/05/2021   ALKPHOS 107 04/18/2019   AST 29 07/05/2021   ALT 16 07/05/2021   PROT 6.3 07/05/2021   ALBUMIN 4.2 04/18/2019   CALCIUM 9.8 07/05/2021   GFRAA 63 02/02/2021    Speciality Comments: No specialty comments available.  Procedures:  No procedures performed Allergies: Patient has no known allergies.   Assessment / Plan:     Visit Diagnoses: Rheumatoid arthritis of multiple sites with negative rheumatoid factor (HCC) - +CCP with nodulosis: He has no joint tenderness or synovitis on examination today.  He has not had any signs or symptoms of a rheumatoid arthritis flare recently.  He has clinically been doing well taking methotrexate 3 tablets by mouth once weekly.  He continues to tolerate methotrexate without any side effects and has not missed any doses recently.  He experiences occasional pain and stiffness in both hands and both hip joints but has not noticed any inflammation.  The rheumatoid nodules on his left index and middle finger are unchanged.  He has not noticed any new nodules.  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   Discussed the importance of regular exercise.  He will remain on methotrexate as prescribed.  CBC and CMP will be updated today to monitor for drug toxicity.  He was advised to notify us if he develops increased joint pain or joint swelling.  He will follow-up in the office in 5 months.  High risk medication use - Methotrexate 3 tablets by mouth every week, folic acid 016 mcg by mouth daily.  CBC and CMP updated on 07/05/21.  Red blood cell count was 4.08, hemoglobin 12.8, and platelet count 139. CBC updated on 07/21/21: Hemoglobin improved to 13.0 and platelet count was within normal limits: 162 at that time.  He is due to update lab work today.  Orders released.  His next lab work will be due in April and every 3 months.  Standing orders for CBC and CMP remain in place.- Plan: CBC with Differential/Platelet, COMPLETE METABOLIC PANEL WITH GFR He has not had any recent infections.  Discussed the importance of holding methotrexate if he develops signs or symptoms of an infection and to resume once infection has completely cleared.  He voiced understanding. He has not had any new medical conditions since his last office visit.  No medication changes.  Rheumatoid nodules (HCC) - Unchanged.  Rheumatoid nodules noted on the left index and middle finger.   Other medical conditions are listed as follows:   History of hypercholesterolemia  History of coronary artery disease  History of hypertension: BP was 135/77 today in the office.   History of asthma  Orders: Orders Placed This Encounter  Procedures   CBC with Differential/Platelet   COMPLETE METABOLIC PANEL WITH GFR   Meds ordered this encounter  Medications   methotrexate 2.5 MG tablet    Sig: Take 3 tablets by mouth once a week (Caution: chemotherapy. Protect from light)    Dispense:  36 tablet    Refill:  0    Follow-Up Instructions: Return in about 5 months (around 03/01/2022) for Rheumatoid arthritis.   Ofilia Neas, PA-C  Note  - This record has been created using Dragon software.  Chart creation errors have been sought, but may not always  have  been located. Such creation errors do not reflect on  the standard of medical care.

## 2021-09-29 DIAGNOSIS — I10 Essential (primary) hypertension: Secondary | ICD-10-CM | POA: Diagnosis not present

## 2021-09-29 DIAGNOSIS — R7989 Other specified abnormal findings of blood chemistry: Secondary | ICD-10-CM | POA: Diagnosis not present

## 2021-09-29 DIAGNOSIS — E559 Vitamin D deficiency, unspecified: Secondary | ICD-10-CM | POA: Diagnosis not present

## 2021-09-29 DIAGNOSIS — E785 Hyperlipidemia, unspecified: Secondary | ICD-10-CM | POA: Diagnosis not present

## 2021-10-01 ENCOUNTER — Other Ambulatory Visit: Payer: Self-pay

## 2021-10-01 ENCOUNTER — Ambulatory Visit: Payer: PPO | Admitting: Physician Assistant

## 2021-10-01 ENCOUNTER — Encounter: Payer: Self-pay | Admitting: Physician Assistant

## 2021-10-01 VITALS — BP 135/77 | HR 61 | Ht 74.0 in | Wt 206.0 lb

## 2021-10-01 DIAGNOSIS — M0630 Rheumatoid nodule, unspecified site: Secondary | ICD-10-CM

## 2021-10-01 DIAGNOSIS — Z8709 Personal history of other diseases of the respiratory system: Secondary | ICD-10-CM

## 2021-10-01 DIAGNOSIS — Z8679 Personal history of other diseases of the circulatory system: Secondary | ICD-10-CM | POA: Diagnosis not present

## 2021-10-01 DIAGNOSIS — Z79899 Other long term (current) drug therapy: Secondary | ICD-10-CM | POA: Diagnosis not present

## 2021-10-01 DIAGNOSIS — M0609 Rheumatoid arthritis without rheumatoid factor, multiple sites: Secondary | ICD-10-CM

## 2021-10-01 DIAGNOSIS — Z8639 Personal history of other endocrine, nutritional and metabolic disease: Secondary | ICD-10-CM | POA: Diagnosis not present

## 2021-10-01 MED ORDER — METHOTREXATE SODIUM 2.5 MG PO TABS: ORAL_TABLET | ORAL | 0 refills | Status: DC

## 2021-10-01 NOTE — Patient Instructions (Addendum)
Please stop methotrexate if you develop signs or symptoms of an infection and resume methotrexate once the infection has completely cleared.     Standing Labs We placed an order today for your standing lab work.   Please have your standing labs drawn in April and every 3 months   If possible, please have your labs drawn 2 weeks prior to your appointment so that the provider can discuss your results at your appointment.  Please note that you may see your imaging and lab results in [place] before we have reviewed them. We may be awaiting multiple results to interpret others before contacting you. Please allow our office up to 72 hours to thoroughly review all of the results before contacting the office for clarification of your results.  We have open lab daily: Monday through Thursday from 1:30-4:30 PM and Friday from 1:30-4:00 PM at the office of Dr. Bo Merino, [facility].   Please be advised, all patients with office appointments requiring lab work will take precedent over walk-in lab work.  If possible, please come for your lab work on Monday and Friday afternoons, as you may experience shorter wait times. The office is located at [address], [place], [place], [place]  [address] No appointment is necessary.   Labs are drawn by [facility]. Please bring your co-pay at the time of your lab draw.  You may receive a bill from [facility] for your lab work.  Please note if you are on Hydroxychloroquine and and an order has been placed for a Hydroxychloroquine level, you will need to have it drawn 4 hours or more after your last dose.  If you wish to have your labs drawn at another location, please call the office 24 hours in advance to send orders.  If you have any questions regarding directions or hours of operation,  please call [phone].   As a reminder, please drink plenty of water prior to coming for your lab work. Thanks!

## 2021-10-02 LAB — CBC WITH DIFFERENTIAL/PLATELET
Absolute Monocytes: 484 cells/uL (ref 200–950)
Basophils Absolute: 22 cells/uL (ref 0–200)
Basophils Relative: 0.5 %
Eosinophils Absolute: 202 cells/uL (ref 15–500)
Eosinophils Relative: 4.6 %
HCT: 40.6 % (ref 38.5–50.0)
Hemoglobin: 13.6 g/dL (ref 13.2–17.1)
Lymphs Abs: 928 cells/uL (ref 850–3900)
MCH: 32.9 pg (ref 27.0–33.0)
MCHC: 33.5 g/dL (ref 32.0–36.0)
MCV: 98.1 fL (ref 80.0–100.0)
MPV: 12.1 fL (ref 7.5–12.5)
Monocytes Relative: 11 %
Neutro Abs: 2763 cells/uL (ref 1500–7800)
Neutrophils Relative %: 62.8 %
Platelets: 154 10*3/uL (ref 140–400)
RBC: 4.14 10*6/uL — ABNORMAL LOW (ref 4.20–5.80)
RDW: 12.8 % (ref 11.0–15.0)
Total Lymphocyte: 21.1 %
WBC: 4.4 10*3/uL (ref 3.8–10.8)

## 2021-10-02 LAB — COMPLETE METABOLIC PANEL WITH GFR
AG Ratio: 1.7 (calc) (ref 1.0–2.5)
ALT: 8 U/L — ABNORMAL LOW (ref 9–46)
AST: 18 U/L (ref 10–35)
Albumin: 3.8 g/dL (ref 3.6–5.1)
Alkaline phosphatase (APISO): 93 U/L (ref 35–144)
BUN: 20 mg/dL (ref 7–25)
CO2: 28 mmol/L (ref 20–32)
Calcium: 9.5 mg/dL (ref 8.6–10.3)
Chloride: 108 mmol/L (ref 98–110)
Creat: 1.02 mg/dL (ref 0.70–1.22)
Globulin: 2.3 g/dL (calc) (ref 1.9–3.7)
Glucose, Bld: 75 mg/dL (ref 65–99)
Potassium: 4.5 mmol/L (ref 3.5–5.3)
Sodium: 142 mmol/L (ref 135–146)
Total Bilirubin: 0.7 mg/dL (ref 0.2–1.2)
Total Protein: 6.1 g/dL (ref 6.1–8.1)
eGFR: 74 mL/min/{1.73_m2} (ref >=60)

## 2021-10-04 NOTE — Progress Notes (Signed)
CMP WNL.RBC count is borderline elevated. Rest of CBC WNL.

## 2021-10-06 DIAGNOSIS — E559 Vitamin D deficiency, unspecified: Secondary | ICD-10-CM | POA: Diagnosis not present

## 2021-10-06 DIAGNOSIS — D692 Other nonthrombocytopenic purpura: Secondary | ICD-10-CM | POA: Diagnosis not present

## 2021-10-06 DIAGNOSIS — E785 Hyperlipidemia, unspecified: Secondary | ICD-10-CM | POA: Diagnosis not present

## 2021-10-06 DIAGNOSIS — I2510 Atherosclerotic heart disease of native coronary artery without angina pectoris: Secondary | ICD-10-CM | POA: Diagnosis not present

## 2021-10-06 DIAGNOSIS — D84821 Immunodeficiency due to drugs: Secondary | ICD-10-CM | POA: Diagnosis not present

## 2021-10-06 DIAGNOSIS — I700 Atherosclerosis of aorta: Secondary | ICD-10-CM | POA: Diagnosis not present

## 2021-10-06 DIAGNOSIS — I10 Essential (primary) hypertension: Secondary | ICD-10-CM | POA: Diagnosis not present

## 2021-10-06 DIAGNOSIS — Z1331 Encounter for screening for depression: Secondary | ICD-10-CM | POA: Diagnosis not present

## 2021-10-06 DIAGNOSIS — Z0000 Encounter for general adult medical examination without abnormal findings: Secondary | ICD-10-CM | POA: Diagnosis not present

## 2021-10-06 DIAGNOSIS — M069 Rheumatoid arthritis, unspecified: Secondary | ICD-10-CM | POA: Diagnosis not present

## 2021-10-06 DIAGNOSIS — Z1339 Encounter for screening examination for other mental health and behavioral disorders: Secondary | ICD-10-CM | POA: Diagnosis not present

## 2021-11-29 ENCOUNTER — Other Ambulatory Visit: Payer: Self-pay | Admitting: *Deleted

## 2021-11-29 DIAGNOSIS — Z79899 Other long term (current) drug therapy: Secondary | ICD-10-CM | POA: Diagnosis not present

## 2021-11-30 LAB — COMPLETE METABOLIC PANEL WITH GFR
AG Ratio: 1.7 (calc) (ref 1.0–2.5)
ALT: 12 U/L (ref 9–46)
AST: 25 U/L (ref 10–35)
Albumin: 3.9 g/dL (ref 3.6–5.1)
Alkaline phosphatase (APISO): 98 U/L (ref 35–144)
BUN: 17 mg/dL (ref 7–25)
CO2: 30 mmol/L (ref 20–32)
Calcium: 9.4 mg/dL (ref 8.6–10.3)
Chloride: 105 mmol/L (ref 98–110)
Creat: 1.06 mg/dL (ref 0.70–1.22)
Globulin: 2.3 g/dL (calc) (ref 1.9–3.7)
Glucose, Bld: 74 mg/dL (ref 65–99)
Potassium: 3.8 mmol/L (ref 3.5–5.3)
Sodium: 141 mmol/L (ref 135–146)
Total Bilirubin: 0.7 mg/dL (ref 0.2–1.2)
Total Protein: 6.2 g/dL (ref 6.1–8.1)
eGFR: 71 mL/min/{1.73_m2} (ref >=60)

## 2021-11-30 LAB — CBC WITH DIFFERENTIAL/PLATELET
Absolute Monocytes: 362 cells/uL (ref 200–950)
Basophils Absolute: 28 cells/uL (ref 0–200)
Basophils Relative: 0.6 %
Eosinophils Absolute: 273 cells/uL (ref 15–500)
Eosinophils Relative: 5.8 %
HCT: 41 % (ref 38.5–50.0)
Hemoglobin: 13.7 g/dL (ref 13.2–17.1)
Lymphs Abs: 954 cells/uL (ref 850–3900)
MCH: 32.5 pg (ref 27.0–33.0)
MCHC: 33.4 g/dL (ref 32.0–36.0)
MCV: 97.2 fL (ref 80.0–100.0)
MPV: 11.6 fL (ref 7.5–12.5)
Monocytes Relative: 7.7 %
Neutro Abs: 3083 cells/uL (ref 1500–7800)
Neutrophils Relative %: 65.6 %
Platelets: 175 10*3/uL (ref 140–400)
RBC: 4.22 10*6/uL (ref 4.20–5.80)
RDW: 12.9 % (ref 11.0–15.0)
Total Lymphocyte: 20.3 %
WBC: 4.7 10*3/uL (ref 3.8–10.8)

## 2021-11-30 NOTE — Progress Notes (Signed)
CBC and CMP are normal.

## 2022-02-14 DIAGNOSIS — A8489 Other tick-borne viral encephalitis: Secondary | ICD-10-CM | POA: Diagnosis not present

## 2022-02-14 DIAGNOSIS — S30860A Insect bite (nonvenomous) of lower back and pelvis, initial encounter: Secondary | ICD-10-CM | POA: Diagnosis not present

## 2022-02-17 NOTE — Progress Notes (Signed)
Office Visit Note  Patient: Carlos Patton             Date of Birth: [birth year]           MRN: [id number]             PCP: Velna Hatchet, MD Referring: Velna Hatchet, MD Visit Date: 03/02/2022 Occupation: '@GUAROCC'$ @  Subjective:  Medication management  History of Present Illness: Carlos Patton is a 81 y.o. male with history of seronegative rheumatoid arthritis and osteoarthritis.  He has been taking methotrexate 3 tablets weekly without any side effects.  He denies any increased joint swelling.  He states he have several acres of yard which he mows and maintains on a regular basis.  He gets some stiffness in his joints after doing the yard work.  He has intermittent discomfort in his lower back.    Activities of Daily Living:  Patient reports morning stiffness for 3 minutes.   Patient Denies nocturnal pain.  Difficulty dressing/grooming: Denies Difficulty climbing stairs: Denies Difficulty getting out of chair: Denies Difficulty using hands for taps, buttons, cutlery, and/or writing: Denies  Review of Systems  Constitutional:  Negative for fatigue.  HENT:  Negative for mouth dryness.   Eyes:  Negative for dryness.  Respiratory:  Negative for shortness of breath.   Cardiovascular:  Positive for swelling in legs/feet.  Gastrointestinal:  Negative for constipation.  Endocrine: Negative for increased urination.  Genitourinary:  Negative for difficulty urinating.  Musculoskeletal:  Positive for morning stiffness.  Skin:  Negative for rash.  Allergic/Immunologic: Negative for susceptible to infections.  Neurological:  Negative for numbness.  Hematological:  Negative for bruising/bleeding tendency.  Psychiatric/Behavioral:  Negative for sleep disturbance.     PMFS History:  Patient Active Problem List   Diagnosis Date Noted   Sepsis ([place]) 09/12/2018   CAP (community acquired pneumonia) 09/12/2018   Hypertension    Rheumatoid arthritis of multiple sites with  negative rheumatoid factor ([place]) 03/07/2018   Rheumatoid nodules ([place]) 03/07/2018   High risk medication use 03/07/2018   History of asthma 03/07/2018   History of hypertension 03/07/2018   History of hypercholesterolemia 03/07/2018   History of coronary artery disease 03/07/2018   Benign prostatic hyperplasia with urinary obstruction 06/24/2016   Elevated prostate specific antigen (PSA) 07/28/2011   Coronary atherosclerosis 04/27/2010   NONSPECIFIC ABNORMAL FINDING IN STOOL CONTENTS 04/27/2010   PERSONAL HISTORY OF COLONIC POLYPS 04/27/2010    Past Medical History:  Diagnosis Date   Allergy    Arthritis    Asthma    oc flare in the past    CAP (community acquired pneumonia)    Cataract    Heart murmur    History of colon polyps    Hyperlipidemia    Hypertension    Myocardial infarction ([place])    2 MI--1980's, 1999   Sepsis ([place])     Family History  Problem Relation Age of Onset   Colon cancer Sister    Arthritis Mother    Stroke Father    Heart attack Father    Heart Problems Brother    Heart Problems Brother    Gout Son    Colon polyps Neg Hx    Esophageal cancer Neg Hx    Rectal cancer Neg Hx    Stomach cancer Neg Hx    Past Surgical History:  Procedure Laterality Date   CARDIAC CATHETERIZATION     CATARACT EXTRACTION Bilateral    COLONOSCOPY     CORONARY  STENT PLACEMENT     INGUINAL HERNIA REPAIR Bilateral    POLYPECTOMY     Social History   Social History Narrative   Not on file   Immunization History  Administered Date(s) Administered   Moderna Sars-Covid-2 Vaccination 10/25/2019, 11/22/2019, 08/23/2020     Objective: Vital Signs: BP (!) 150/94 (BP Location: Left Arm, Patient Position: Sitting, Cuff Size: Small)   Pulse 60   Resp 12   Ht '6\' 2"'$  (1.88 m)   Wt 208 lb 6.4 oz (94.5 kg)   BMI 26.76 kg/m    Physical Exam Vitals and nursing note reviewed.  Constitutional:      Appearance: He is well-developed.  HENT:     Head: Normocephalic and  atraumatic.  Eyes:     Conjunctiva/sclera: Conjunctivae normal.     Pupils: Pupils are equal, round, and reactive to light.  Cardiovascular:     Rate and Rhythm: Normal rate and regular rhythm.     Heart sounds: Normal heart sounds.  Pulmonary:     Effort: Pulmonary effort is normal.     Breath sounds: Normal breath sounds.  Abdominal:     General: Bowel sounds are normal.     Palpations: Abdomen is soft.  Musculoskeletal:     Cervical back: Normal range of motion and neck supple.  Skin:    General: Skin is warm and dry.     Capillary Refill: Capillary refill takes less than 2 seconds.  Neurological:     Mental Status: He is alert and oriented to person, place, and time.  Psychiatric:        Behavior: Behavior normal.      Musculoskeletal Exam: C-spine was in good range of motion.  He has lumbar scoliosis and discomfort range of motion of his lumbar spine.  Shoulder joints, elbow joints, wrist joints, MCPs PIPs and DIPs with good range of motion.  He had bilateral PIP and DIP thickening.  He had nodulosis over left index and middle finger.  Hip joints and knee joints in good range of motion.  There was no tenderness over ankles or MTPs.  CDAI Exam: CDAI Score: 0.7  Patient Global: 5 mm; Provider Global: 2 mm Swollen: 0 ; Tender: 0  Joint Exam 03/02/2022   No joint exam has been documented for this visit   There is currently no information documented on the homunculus. Go to the Rheumatology activity and complete the homunculus joint exam.  Investigation: No additional findings.  Imaging: No results found.  Recent Labs: Lab Results  Component Value Date   WBC 4.7 11/29/2021   HGB 13.7 11/29/2021   PLT 175 11/29/2021   NA 141 11/29/2021   K 3.8 11/29/2021   CL 105 11/29/2021   CO2 30 11/29/2021   GLUCOSE 74 11/29/2021   BUN 17 11/29/2021   CREATININE 1.06 11/29/2021   BILITOT 0.7 11/29/2021   ALKPHOS 107 04/18/2019   AST 25 11/29/2021   ALT 12 11/29/2021    PROT 6.2 11/29/2021   ALBUMIN 4.2 04/18/2019   CALCIUM 9.4 11/29/2021   GFRAA 63 02/02/2021    Speciality Comments: No specialty comments available.  Procedures:  No procedures performed Allergies: Patient has no known allergies.   Assessment / Plan:     Visit Diagnoses: Rheumatoid arthritis of multiple sites with negative rheumatoid factor (HCC) - +CCP with nodulosis:  -He denies any increased joint swelling.  He has some discomfort after yard work.  No synovitis was noted on the examination.  I  will obtain x-rays today to monitor for disease progression.  Plan: XR Hand 2 View Right, XR Hand 2 View Left, XR Foot 2 Views Right, XR Foot 2 Views Left x-rays were consistent with rheumatoid arthritis and osteoarthritis overlap.  Erosive changes were noted in bilateral feet and possibly right wrist joint.  No comparison films were available.  High risk medication use - Methotrexate 3 tablets by mouth every week, folic acid 1 mg  by mouth daily.  -Labs obtained on November 29, 2021 were reviewed which were within normal limits.  Plan: CBC with Differential/Platelet, COMPLETE METABOLIC PANEL WITH GFR today and then every 3 months to monitor for drug toxicity.  Rheumatoid nodules (HCC) - Unchanged.  Rheumatoid nodules noted on the left index and middle finger.   DDD (degenerative disc disease), lumbar-x-rays obtained in November 2019 showed multilevel spondylosis with scoliosis and facet joint arthropathy.  He has intermittent discomfort.  Other idiopathic scoliosis, lumbar region  Other medical problems are listed as follows:  History of hypercholesterolemia  History of coronary artery disease  History of hypertension-his blood pressure was elevated today.  I advised him to monitor blood pressure closely and follow-up with his PCP.  History of asthma  Orders: Orders Placed This Encounter  Procedures   XR Hand 2 View Right   XR Hand 2 View Left   XR Foot 2 Views Right   XR Foot 2 Views  Left   CBC with Differential/Platelet   COMPLETE METABOLIC PANEL WITH GFR   No orders of the defined types were placed in this encounter.    Follow-Up Instructions: Return in about 5 months (around 08/02/2022) for Rheumatoid arthritis.   Bo Merino, MD  Note - This record has been created using Editor, commissioning.  Chart creation errors have been sought, but may not always  have been located. Such creation errors do not reflect on  the standard of medical care.

## 2022-02-24 ENCOUNTER — Other Ambulatory Visit: Payer: Self-pay | Admitting: Rheumatology

## 2022-02-24 MED ORDER — METHOTREXATE SODIUM 2.5 MG PO TABS: ORAL_TABLET | ORAL | 0 refills | Status: DC

## 2022-02-24 NOTE — Telephone Encounter (Signed)
Patient called the office requesting a refill of Methotrexate 2.'5mg'$  to be sent to [facility]. Patient states he is almost out of medication.

## 2022-02-24 NOTE — Telephone Encounter (Signed)
Next Visit: 03/02/2022  Last Visit: 10/01/2021  Last Fill: 10/01/2021  DX: Rheumatoid arthritis of multiple sites with negative rheumatoid factor   Current Dose per office note 10/01/2021: Methotrexate 3 tablets by mouth every week  Labs: 11/29/2021 CBC and CMP are normal.  Patient will update labs at upcoming appointment on 03/02/2022  Okay to refill MTX?

## 2022-03-02 ENCOUNTER — Ambulatory Visit (INDEPENDENT_AMBULATORY_CARE_PROVIDER_SITE_OTHER): Payer: PPO

## 2022-03-02 ENCOUNTER — Ambulatory Visit: Payer: PPO | Admitting: Rheumatology

## 2022-03-02 ENCOUNTER — Encounter: Payer: Self-pay | Admitting: Rheumatology

## 2022-03-02 VITALS — BP 150/94 | HR 60 | Resp 12 | Ht 74.0 in | Wt 208.4 lb

## 2022-03-02 DIAGNOSIS — Z8709 Personal history of other diseases of the respiratory system: Secondary | ICD-10-CM | POA: Diagnosis not present

## 2022-03-02 DIAGNOSIS — M79671 Pain in right foot: Secondary | ICD-10-CM

## 2022-03-02 DIAGNOSIS — M4126 Other idiopathic scoliosis, lumbar region: Secondary | ICD-10-CM | POA: Diagnosis not present

## 2022-03-02 DIAGNOSIS — M0609 Rheumatoid arthritis without rheumatoid factor, multiple sites: Secondary | ICD-10-CM

## 2022-03-02 DIAGNOSIS — M79641 Pain in right hand: Secondary | ICD-10-CM

## 2022-03-02 DIAGNOSIS — Z8679 Personal history of other diseases of the circulatory system: Secondary | ICD-10-CM | POA: Diagnosis not present

## 2022-03-02 DIAGNOSIS — M79642 Pain in left hand: Secondary | ICD-10-CM

## 2022-03-02 DIAGNOSIS — M5136 Other intervertebral disc degeneration, lumbar region: Secondary | ICD-10-CM | POA: Diagnosis not present

## 2022-03-02 DIAGNOSIS — Z79899 Other long term (current) drug therapy: Secondary | ICD-10-CM

## 2022-03-02 DIAGNOSIS — M0630 Rheumatoid nodule, unspecified site: Secondary | ICD-10-CM | POA: Diagnosis not present

## 2022-03-02 DIAGNOSIS — Z8639 Personal history of other endocrine, nutritional and metabolic disease: Secondary | ICD-10-CM | POA: Diagnosis not present

## 2022-03-02 NOTE — Patient Instructions (Signed)
Standing Labs We placed an order today for your standing lab work.   Please have your standing labs drawn in September and every 3 months  If possible, please have your labs drawn 2 weeks prior to your appointment so that the provider can discuss your results at your appointment.  Please note that you may see your imaging and lab results in MyChart before we have reviewed them. We may be awaiting multiple results to interpret others before contacting you. Please allow our office up to 72 hours to thoroughly review all of the results before contacting the office for clarification of your results.  We have open lab daily: Monday through Thursday from 1:30-4:30 PM and Friday from 1:30-4:00 PM at the office of Dr. Iley Breeden, [facility].   Please be advised, all patients with office appointments requiring lab work will take precedent over walk-in lab work.  If possible, please come for your lab work on Monday and Friday afternoons, as you may experience shorter wait times. The office is located at [address], [address], [place], [place] [address] No appointment is necessary.   Labs are drawn by [facility]. Please bring your co-pay at the time of your lab draw.  You may receive a bill from [facility] for your lab work.  Please note if you are on Hydroxychloroquine and and an order has been placed for a Hydroxychloroquine level, you will need to have it drawn 4 hours or more after your last dose.  If you wish to have your labs drawn at another location, please call the office 24 hours in advance to send orders.  If you have any questions regarding directions or hours of operation,  please call [phone].   As a reminder, please drink plenty of water prior to coming for your lab work. Thanks!   Vaccines You are taking a medication(s) that can suppress your immune system.  The following immunizations are recommended: Flu annually Covid-19  Td/Tdap (tetanus, diphtheria,  pertussis) every 10 years Pneumonia (Prevnar 15 then Pneumovax 23 at least 1 year apart.  Alternatively, can take Prevnar 20 without needing additional dose) Shingrix: 2 doses from 4 weeks to 6 months apart  Please check with your PCP to make sure you are up to date.   If you have signs or symptoms of an infection or start antibiotics: First, call your PCP for workup of your infection. Hold your medication through the infection, until you complete your antibiotics, and until symptoms resolve if you take the following: Injectable medication (Actemra, Benlysta, Cimzia, Cosentyx, Enbrel, Humira, Kevzara, Orencia, Remicade, Simponi, Stelara, Taltz, Tremfya) Methotrexate Leflunomide (Arava) Mycophenolate (Cellcept) Xeljanz, Olumiant, or Rinvoq  

## 2022-03-03 LAB — COMPLETE METABOLIC PANEL WITH GFR
AG Ratio: 1.8 (calc) (ref 1.0–2.5)
ALT: 13 U/L (ref 9–46)
AST: 27 U/L (ref 10–35)
Albumin: 4.2 g/dL (ref 3.6–5.1)
Alkaline phosphatase (APISO): 102 U/L (ref 35–144)
BUN: 15 mg/dL (ref 7–25)
CO2: 31 mmol/L (ref 20–32)
Calcium: 9.9 mg/dL (ref 8.6–10.3)
Chloride: 105 mmol/L (ref 98–110)
Creat: 1.08 mg/dL (ref 0.70–1.22)
Globulin: 2.4 g/dL (calc) (ref 1.9–3.7)
Glucose, Bld: 78 mg/dL (ref 65–99)
Potassium: 4.6 mmol/L (ref 3.5–5.3)
Sodium: 141 mmol/L (ref 135–146)
Total Bilirubin: 0.9 mg/dL (ref 0.2–1.2)
Total Protein: 6.6 g/dL (ref 6.1–8.1)
eGFR: 69 mL/min/{1.73_m2} (ref >=60)

## 2022-03-03 LAB — CBC WITH DIFFERENTIAL/PLATELET
Absolute Monocytes: 618 cells/uL (ref 200–950)
Basophils Absolute: 30 cells/uL (ref 0–200)
Basophils Relative: 0.5 %
Eosinophils Absolute: 246 cells/uL (ref 15–500)
Eosinophils Relative: 4.1 %
HCT: 43.2 % (ref 38.5–50.0)
Hemoglobin: 14.5 g/dL (ref 13.2–17.1)
Lymphs Abs: 1272 cells/uL (ref 850–3900)
MCH: 33 pg (ref 27.0–33.0)
MCHC: 33.6 g/dL (ref 32.0–36.0)
MCV: 98.4 fL (ref 80.0–100.0)
MPV: 11.1 fL (ref 7.5–12.5)
Monocytes Relative: 10.3 %
Neutro Abs: 3834 cells/uL (ref 1500–7800)
Neutrophils Relative %: 63.9 %
Platelets: 187 10*3/uL (ref 140–400)
RBC: 4.39 10*6/uL (ref 4.20–5.80)
RDW: 12.6 % (ref 11.0–15.0)
Total Lymphocyte: 21.2 %
WBC: 6 10*3/uL (ref 3.8–10.8)

## 2022-03-03 NOTE — Progress Notes (Signed)
CBC and CMP are normal.

## 2022-03-04 ENCOUNTER — Other Ambulatory Visit: Payer: Self-pay | Admitting: Rheumatology

## 2022-03-04 MED ORDER — METHOTREXATE SODIUM 2.5 MG PO TABS: ORAL_TABLET | ORAL | 0 refills | Status: DC

## 2022-03-04 NOTE — Telephone Encounter (Signed)
Next Visit: 08/02/2022  Last Visit: 03/02/2022  DX: Rheumatoid arthritis of multiple sites with negative rheumatoid factor   Current Dose per office note 03/02/2022: Methotrexate 3 tablets by mouth every week  Labs: 03/02/2022 CBC and CMP are normal.  Okay to refill MTX 30 day supply to local pharmacy?

## 2022-03-04 NOTE — Telephone Encounter (Signed)
Patient called the office stating we had sent in a refill of Methotrexate 2.'5mg'$  to [facility] and after a few days he had not received it. Patient states he called the pharmacy and they told him they had not processed it and that it would take 7-10 days to process. Patient requests a refill of Methotrexate 2.'5mg'$  to be sent to [facility] in [place]. Patient states he is due to take MTX on Sunday.

## 2022-03-19 DIAGNOSIS — J0100 Acute maxillary sinusitis, unspecified: Secondary | ICD-10-CM | POA: Diagnosis not present

## 2022-03-19 DIAGNOSIS — R0981 Nasal congestion: Secondary | ICD-10-CM | POA: Diagnosis not present

## 2022-05-27 ENCOUNTER — Other Ambulatory Visit: Payer: Self-pay | Admitting: *Deleted

## 2022-05-27 DIAGNOSIS — Z79899 Other long term (current) drug therapy: Secondary | ICD-10-CM | POA: Diagnosis not present

## 2022-05-28 LAB — COMPLETE METABOLIC PANEL WITH GFR
AG Ratio: 2 (calc) (ref 1.0–2.5)
ALT: 10 U/L (ref 9–46)
AST: 19 U/L (ref 10–35)
Albumin: 4.3 g/dL (ref 3.6–5.1)
Alkaline phosphatase (APISO): 92 U/L (ref 35–144)
BUN: 17 mg/dL (ref 7–25)
CO2: 28 mmol/L (ref 20–32)
Calcium: 9.7 mg/dL (ref 8.6–10.3)
Chloride: 107 mmol/L (ref 98–110)
Creat: 1.14 mg/dL (ref 0.70–1.22)
Globulin: 2.1 g/dL (calc) (ref 1.9–3.7)
Glucose, Bld: 84 mg/dL (ref 65–99)
Potassium: 4.3 mmol/L (ref 3.5–5.3)
Sodium: 141 mmol/L (ref 135–146)
Total Bilirubin: 0.8 mg/dL (ref 0.2–1.2)
Total Protein: 6.4 g/dL (ref 6.1–8.1)
eGFR: 65 mL/min/{1.73_m2} (ref >=60)

## 2022-05-28 LAB — CBC WITH DIFFERENTIAL/PLATELET
Absolute Monocytes: 520 cells/uL (ref 200–950)
Basophils Absolute: 41 cells/uL (ref 0–200)
Basophils Relative: 0.9 %
Eosinophils Absolute: 239 cells/uL (ref 15–500)
Eosinophils Relative: 5.2 %
HCT: 41.7 % (ref 38.5–50.0)
Hemoglobin: 13.9 g/dL (ref 13.2–17.1)
Lymphs Abs: 966 cells/uL (ref 850–3900)
MCH: 32.3 pg (ref 27.0–33.0)
MCHC: 33.3 g/dL (ref 32.0–36.0)
MCV: 97 fL (ref 80.0–100.0)
MPV: 11.3 fL (ref 7.5–12.5)
Monocytes Relative: 11.3 %
Neutro Abs: 2834 cells/uL (ref 1500–7800)
Neutrophils Relative %: 61.6 %
Platelets: 164 10*3/uL (ref 140–400)
RBC: 4.3 10*6/uL (ref 4.20–5.80)
RDW: 12.9 % (ref 11.0–15.0)
Total Lymphocyte: 21 %
WBC: 4.6 10*3/uL (ref 3.8–10.8)

## 2022-05-30 NOTE — Progress Notes (Signed)
CBC and CMP are normal.

## 2022-06-03 ENCOUNTER — Other Ambulatory Visit: Payer: Self-pay | Admitting: Rheumatology

## 2022-06-03 MED ORDER — METHOTREXATE SODIUM 2.5 MG PO TABS
ORAL_TABLET | ORAL | 0 refills | Status: DC
Start: 2022-06-03 — End: 2022-08-02

## 2022-06-03 NOTE — Telephone Encounter (Signed)
Next Visit: 08/02/2022  Last Visit: 03/02/2022  Last Fill: 03/04/2022  DX: Rheumatoid arthritis of multiple sites with negative rheumatoid factor   Current Dose per office note 03/02/2022: Methotrexate 3 tablets by mouth every week  Labs: 05/27/2022 CBC and CMP are normal.  Okay to refill MTX?

## 2022-06-03 NOTE — Telephone Encounter (Signed)
Patient called requesting prescription refill of Methotrexate to be sent to [facility].

## 2022-07-19 NOTE — Progress Notes (Unsigned)
Office Visit Note  Patient: Carlos Patton             Date of Birth: 02/08/1941           MRN: [id number]             PCP: Velna Hatchet, MD Referring: Velna Hatchet, MD Visit Date: 08/02/2022 Occupation: '@GUAROCC'$ @  Subjective:  Medication monitoring   History of Present Illness: Carlos Patton is a 81 y.o. male with history of seronegative rheumatoid arthritis and DDD. Patient remains on Methotrexate 3 tablets by mouth every week and folic acid 1 mg by mouth daily.  Patient continues to tolerate methotrexate without any side effects and has not missed any doses recently.  He denies any signs or symptoms of a rheumatoid arthritis flare.  He experiences intermittent pain and stiffness in both hands especially with strenuous activities.  Overall he has continued to remain active and has not had any difficulty with ADLs.  He states the rheumatoid nodules on his hands are unchanged.  He denies any new nodules.  He denies any new medical conditions.  He denies any recent or recurrent infections.  He has not yet received the annual flu shot.      Activities of Daily Living:  Patient reports morning stiffness for less than 30 minutes.   Patient Denies nocturnal pain.  Difficulty dressing/grooming: Denies Difficulty climbing stairs: Denies Difficulty getting out of chair: Denies Difficulty using hands for taps, buttons, cutlery, and/or writing: Denies  Review of Systems  Constitutional:  Positive for fatigue.  HENT:  Negative for mouth sores and mouth dryness.   Eyes:  Negative for dryness.  Respiratory:  Negative for shortness of breath.   Cardiovascular:  Negative for chest pain and palpitations.  Gastrointestinal:  Negative for blood in stool, constipation and diarrhea.  Endocrine: Negative for increased urination.  Genitourinary:  Negative for involuntary urination.  Musculoskeletal:  Positive for morning stiffness. Negative for joint pain, gait problem, joint pain,  joint swelling, myalgias, muscle weakness, muscle tenderness and myalgias.  Skin:  Negative for color change, rash, hair loss and sensitivity to sunlight.  Allergic/Immunologic: Negative for susceptible to infections.  Neurological:  Negative for dizziness and headaches.  Hematological:  Negative for swollen glands.  Psychiatric/Behavioral:  Negative for depressed mood and sleep disturbance. The patient is not nervous/anxious.     PMFS History:  Patient Active Problem List   Diagnosis Date Noted   Sepsis ([place]) 09/12/2018   CAP (community acquired pneumonia) 09/12/2018   Hypertension    Rheumatoid arthritis of multiple sites with negative rheumatoid factor ([place]) 03/07/2018   Rheumatoid nodules ([place]) 03/07/2018   High risk medication use 03/07/2018   History of asthma 03/07/2018   History of hypertension 03/07/2018   History of hypercholesterolemia 03/07/2018   History of coronary artery disease 03/07/2018   Benign prostatic hyperplasia with urinary obstruction 06/24/2016   Elevated prostate specific antigen (PSA) 07/28/2011   Coronary atherosclerosis 04/27/2010   NONSPECIFIC ABNORMAL FINDING IN STOOL CONTENTS 04/27/2010   PERSONAL HISTORY OF COLONIC POLYPS 04/27/2010    Past Medical History:  Diagnosis Date   Allergy    Arthritis    Asthma    oc flare in the past    CAP (community acquired pneumonia)    Cataract    Heart murmur    History of colon polyps    Hyperlipidemia    Hypertension    Myocardial infarction ([place])    2 MI--1980's, 1999   Sepsis ([place])  Family History  Problem Relation Age of Onset   Colon cancer Sister    Arthritis Mother    Stroke Father    Heart attack Father    Heart Problems Brother    Heart Problems Brother    Gout Son    Colon polyps Neg Hx    Esophageal cancer Neg Hx    Rectal cancer Neg Hx    Stomach cancer Neg Hx    Past Surgical History:  Procedure Laterality Date   CARDIAC CATHETERIZATION     CATARACT EXTRACTION Bilateral     COLONOSCOPY     CORONARY STENT PLACEMENT     INGUINAL HERNIA REPAIR Bilateral    POLYPECTOMY     Social History   Social History Narrative   Not on file   Immunization History  Administered Date(s) Administered   Moderna Sars-Covid-2 Vaccination 10/25/2019, 11/22/2019, 08/23/2020     Objective: Vital Signs: BP (!) 162/97 (BP Location: Left Arm, Patient Position: Sitting, Cuff Size: Normal)   Pulse (!) 57   Resp 15   Ht '6\' 2"'$  (1.88 m)   Wt 209 lb 9.6 oz (95.1 kg)   BMI 26.91 kg/m    Physical Exam Vitals and nursing note reviewed.  Constitutional:      Appearance: He is well-developed.  HENT:     Head: Normocephalic and atraumatic.  Eyes:     Conjunctiva/sclera: Conjunctivae normal.     Pupils: Pupils are equal, round, and reactive to light.  Cardiovascular:     Rate and Rhythm: Normal rate and regular rhythm.     Heart sounds: Normal heart sounds.  Pulmonary:     Effort: Pulmonary effort is normal.     Breath sounds: Normal breath sounds.  Abdominal:     General: Bowel sounds are normal.     Palpations: Abdomen is soft.  Musculoskeletal:     Cervical back: Normal range of motion and neck supple.  Skin:    General: Skin is warm and dry.     Capillary Refill: Capillary refill takes less than 2 seconds.  Neurological:     Mental Status: He is alert and oriented to person, place, and time.  Psychiatric:        Behavior: Behavior normal.      Musculoskeletal Exam: C-spine has good range of motion.  Some postural thoracic kyphosis noted.  No midline spinal tenderness.  Lumbar scoliosis noted.  Shoulder joints, elbow joints, wrist joints, MCPs, PIPs, DIPs have good range of motion with no synovitis.  PIP and DIP thickening consistent with osteoarthritis of both hands.  Nodulosis over the left index and middle finger. Complete fist formation bilaterally.  Hip joints, knee joints, and ankle joints have good ROM.  No warmth or effusion of knee joints.  No tenderness or  swelling of ankle joints.  Severe bunions noted bilaterally.  Tailor bunions also noted.  Overcrowding of toes.  No tenderness or synovitis of MTP joints.   CDAI Exam: CDAI Score: -- Patient Global: 5 mm; Provider Global: 5 mm Swollen: --; Tender: -- Joint Exam 08/02/2022   No joint exam has been documented for this visit   There is currently no information documented on the homunculus. Go to the Rheumatology activity and complete the homunculus joint exam.  Investigation: No additional findings.  Imaging: No results found.  Recent Labs: Lab Results  Component Value Date   WBC 4.6 05/27/2022   HGB 13.9 05/27/2022   PLT 164 05/27/2022   NA 141 05/27/2022  K 4.3 05/27/2022   CL 107 05/27/2022   CO2 28 05/27/2022   GLUCOSE 84 05/27/2022   BUN 17 05/27/2022   CREATININE 1.14 05/27/2022   BILITOT 0.8 05/27/2022   ALKPHOS 107 04/18/2019   AST 19 05/27/2022   ALT 10 05/27/2022   PROT 6.4 05/27/2022   ALBUMIN 4.2 04/18/2019   CALCIUM 9.7 05/27/2022   GFRAA 63 02/02/2021    Speciality Comments: No specialty comments available.  Procedures:  No procedures performed Allergies: Patient has no known allergies.   Assessment / Plan:     Visit Diagnoses: Rheumatoid arthritis of multiple sites with negative rheumatoid factor (HCC) - +CCP with nodulosis: He has no synovitis on examination today.  He has not had any signs or symptoms of a rheumatoid arthritis flare.  He has occasional discomfort in his hands which is exacerbated by strenuous activities.  On examination he has PIP and DIP thickening consistent with osteoarthritis but no active inflammation was noted.  He is currently taking methotrexate 3 tablets by mouth once weekly and folic acid 1 mg daily.  He has been tolerating methotrexate without any side effects and has not missed any doses recently.  He will remain on methotrexate as prescribed.  He was advised to notify us if he develops increased joint pain or joint swelling.   He will follow-up in the office in 5 months or sooner if needed.  High risk medication use - Methotrexate 3 tablets by mouth every week, folic acid 1 mg  by mouth daily.  CBC and CMP were within normal limits on 05/27/2022.  Orders for CBC and CMP were released today.  His next lab work will be due in February and every 3 months to monitor for drug toxicity.  Standing orders for CBC and CMP will be placed today. He has not had any recent or recurrent infections.  Discussed the importance of holding methotrexate if he develops signs or symptoms of an infection and to resume once the infection has completely cleared. He is planning on getting the annual flu shot.  Discussed that if he decides to get the COVID booster he should hold methotrexate for 1 week after the vaccine. He denies any new medical conditions.  - Plan: COMPLETE METABOLIC PANEL WITH GFR, CBC with Differential/Platelet, CBC with Differential/Platelet, COMPLETE METABOLIC PANEL WITH GFR  Rheumatoid nodules (HCC) - Rheumatoid nodules noted on the left index and middle finger. Unchanged.  No new nodules.    DDD (degenerative disc disease), lumbar - X-rays obtained in November 2019 showed multilevel spondylosis with scoliosis and facet joint arthropathy.  Offered a referral to physical therapy but he declined at this time.  Other idiopathic scoliosis, lumbar region: He has noticed some worsening of his posture.  Offered referral to physical therapy but he has declined at this time.   Other medical conditions are listed as follows:   History of hypercholesterolemia  History of hypertension: Blood pressure was elevated today in the office.  His blood pressure was rechecked prior to leaving.  He was advised to monitor his blood pressure closely at home and reach out to PCP if it remains elevated.   History of coronary artery disease  History of asthma  Orders: Orders Placed This Encounter  Procedures   COMPLETE METABOLIC PANEL WITH  GFR   CBC with Differential/Platelet   CBC with Differential/Platelet   COMPLETE METABOLIC PANEL WITH GFR   Meds ordered this encounter  Medications   methotrexate (RHEUMATREX) 2.5 MG tablet  Sig: Take 3 tablets by mouth once a week (Caution: chemotherapy. Protect from light)    Dispense:  36 tablet    Refill:  0     Follow-Up Instructions: Return in about 5 months (around 01/01/2023) for Rheumatoid arthritis, DDD.   Ofilia Neas, PA-C  Note - This record has been created using Dragon software.  Chart creation errors have been sought, but may not always  have been located. Such creation errors do not reflect on  the standard of medical care.

## 2022-07-22 DIAGNOSIS — H353132 Nonexudative age-related macular degeneration, bilateral, intermediate dry stage: Secondary | ICD-10-CM | POA: Diagnosis not present

## 2022-08-02 ENCOUNTER — Encounter: Payer: Self-pay | Admitting: Physician Assistant

## 2022-08-02 ENCOUNTER — Ambulatory Visit: Payer: PPO | Attending: Physician Assistant | Admitting: Physician Assistant

## 2022-08-02 VITALS — BP 162/97 | HR 57 | Resp 15 | Ht 74.0 in | Wt 209.6 lb

## 2022-08-02 DIAGNOSIS — Z8639 Personal history of other endocrine, nutritional and metabolic disease: Secondary | ICD-10-CM | POA: Diagnosis not present

## 2022-08-02 DIAGNOSIS — Z79899 Other long term (current) drug therapy: Secondary | ICD-10-CM

## 2022-08-02 DIAGNOSIS — M4126 Other idiopathic scoliosis, lumbar region: Secondary | ICD-10-CM

## 2022-08-02 DIAGNOSIS — M51369 Other intervertebral disc degeneration, lumbar region without mention of lumbar back pain or lower extremity pain: Secondary | ICD-10-CM

## 2022-08-02 DIAGNOSIS — M0630 Rheumatoid nodule, unspecified site: Secondary | ICD-10-CM | POA: Diagnosis not present

## 2022-08-02 DIAGNOSIS — Z8679 Personal history of other diseases of the circulatory system: Secondary | ICD-10-CM | POA: Diagnosis not present

## 2022-08-02 DIAGNOSIS — M0609 Rheumatoid arthritis without rheumatoid factor, multiple sites: Secondary | ICD-10-CM

## 2022-08-02 DIAGNOSIS — Z8709 Personal history of other diseases of the respiratory system: Secondary | ICD-10-CM | POA: Diagnosis not present

## 2022-08-02 DIAGNOSIS — M5136 Other intervertebral disc degeneration, lumbar region: Secondary | ICD-10-CM

## 2022-08-02 LAB — CBC WITH DIFFERENTIAL/PLATELET
Absolute Monocytes: 621 cells/uL (ref 200–950)
Basophils Absolute: 23 cells/uL (ref 0–200)
Basophils Relative: 0.4 %
Eosinophils Absolute: 382 cells/uL (ref 15–500)
Eosinophils Relative: 6.7 %
HCT: 41.8 % (ref 38.5–50.0)
Hemoglobin: 14.3 g/dL (ref 13.2–17.1)
Lymphs Abs: 998 cells/uL (ref 850–3900)
MCH: 32.7 pg (ref 27.0–33.0)
MCHC: 34.2 g/dL (ref 32.0–36.0)
MCV: 95.7 fL (ref 80.0–100.0)
MPV: 11.6 fL (ref 7.5–12.5)
Monocytes Relative: 10.9 %
Neutro Abs: 3677 cells/uL (ref 1500–7800)
Neutrophils Relative %: 64.5 %
Platelets: 165 10*3/uL (ref 140–400)
RBC: 4.37 10*6/uL (ref 4.20–5.80)
RDW: 12.3 % (ref 11.0–15.0)
Total Lymphocyte: 17.5 %
WBC: 5.7 10*3/uL (ref 3.8–10.8)

## 2022-08-02 LAB — COMPLETE METABOLIC PANEL WITH GFR
AG Ratio: 2 (calc) (ref 1.0–2.5)
ALT: 13 U/L (ref 9–46)
AST: 21 U/L (ref 10–35)
Albumin: 4.2 g/dL (ref 3.6–5.1)
Alkaline phosphatase (APISO): 100 U/L (ref 35–144)
BUN: 14 mg/dL (ref 7–25)
CO2: 31 mmol/L (ref 20–32)
Calcium: 9.7 mg/dL (ref 8.6–10.3)
Chloride: 105 mmol/L (ref 98–110)
Creat: 0.89 mg/dL (ref 0.70–1.22)
Globulin: 2.1 g/dL (calc) (ref 1.9–3.7)
Glucose, Bld: 83 mg/dL (ref 65–99)
Potassium: 4.3 mmol/L (ref 3.5–5.3)
Sodium: 140 mmol/L (ref 135–146)
Total Bilirubin: 0.7 mg/dL (ref 0.2–1.2)
Total Protein: 6.3 g/dL (ref 6.1–8.1)
eGFR: 86 mL/min/{1.73_m2} (ref >=60)

## 2022-08-02 MED ORDER — METHOTREXATE SODIUM 2.5 MG PO TABS: ORAL_TABLET | ORAL | 0 refills | Status: DC

## 2022-08-02 NOTE — Progress Notes (Signed)
CMP WNL

## 2022-08-02 NOTE — Progress Notes (Signed)
CBC WNL

## 2022-08-02 NOTE — Patient Instructions (Addendum)
Standing Labs We placed an order today for your standing lab work.   Please have your standing labs drawn in February and every 3 months  Please have your labs drawn 2 weeks prior to your appointment so that the provider can discuss your lab results at your appointment.  Please note that you may see your imaging and lab results in MyChart before we have reviewed them. We will contact you once all results are reviewed. Please allow our office up to 72 hours to thoroughly review all of the results before contacting the office for clarification of your results.  Lab hours are:   Monday through Thursday from 8:00 am -12:30 pm and 1:00 pm-5:00 pm and Friday from 8:00 am-12:00 pm.  Please be advised, all patients with office appointments requiring lab work will take precedent over walk-in lab work.   Labs are drawn by [facility]. Please bring your co-pay at the time of your lab draw.  You may receive a bill from [facility] for your lab work.  Please note if you are on Hydroxychloroquine and and an order has been placed for a Hydroxychloroquine level, you will need to have it drawn 4 hours or more after your last dose.  If you wish to have your labs drawn at another location, please call the office 24 hours in advance so we can fax the orders.  The office is located at [address], [address], , [place] [address] No appointment is necessary.    If you have any questions regarding directions or hours of operation,  please call [phone].   As a reminder, please drink plenty of water prior to coming for your lab work. Thanks!   Vaccines You are taking a medication(s) that can suppress your immune system.  The following immunizations are recommended: Flu annually Covid-19  Td/Tdap (tetanus, diphtheria, pertussis) every 10 years Pneumonia (Prevnar 15 then Pneumovax 23 at least 1 year apart.  Alternatively, can take Prevnar 20 without needing additional dose) Shingrix: 2 doses from 4 weeks  to 6 months apart  Please check with your PCP to make sure you are up to date.   If you have signs or symptoms of an infection or start antibiotics: First, call your PCP for workup of your infection. Hold your medication through the infection, until you complete your antibiotics, and until symptoms resolve if you take the following: Injectable medication (Actemra, Benlysta, Cimzia, Cosentyx, Enbrel, Humira, Kevzara, Orencia, Remicade, Simponi, Stelara, Taltz, Tremfya) Methotrexate Leflunomide (Arava) Mycophenolate (Cellcept) Xeljanz, Olumiant, or Rinvoq  

## 2022-08-03 DIAGNOSIS — I10 Essential (primary) hypertension: Secondary | ICD-10-CM | POA: Diagnosis not present

## 2022-08-03 DIAGNOSIS — Z23 Encounter for immunization: Secondary | ICD-10-CM | POA: Diagnosis not present

## 2022-10-10 DIAGNOSIS — I10 Essential (primary) hypertension: Secondary | ICD-10-CM | POA: Diagnosis not present

## 2022-10-10 DIAGNOSIS — R5383 Other fatigue: Secondary | ICD-10-CM | POA: Diagnosis not present

## 2022-10-10 DIAGNOSIS — E785 Hyperlipidemia, unspecified: Secondary | ICD-10-CM | POA: Diagnosis not present

## 2022-10-10 DIAGNOSIS — R7989 Other specified abnormal findings of blood chemistry: Secondary | ICD-10-CM | POA: Diagnosis not present

## 2022-10-10 DIAGNOSIS — E559 Vitamin D deficiency, unspecified: Secondary | ICD-10-CM | POA: Diagnosis not present

## 2022-10-17 DIAGNOSIS — I700 Atherosclerosis of aorta: Secondary | ICD-10-CM | POA: Diagnosis not present

## 2022-10-17 DIAGNOSIS — I10 Essential (primary) hypertension: Secondary | ICD-10-CM | POA: Diagnosis not present

## 2022-10-17 DIAGNOSIS — Z0000 Encounter for general adult medical examination without abnormal findings: Secondary | ICD-10-CM | POA: Diagnosis not present

## 2022-10-17 DIAGNOSIS — D692 Other nonthrombocytopenic purpura: Secondary | ICD-10-CM | POA: Diagnosis not present

## 2022-10-17 DIAGNOSIS — Z23 Encounter for immunization: Secondary | ICD-10-CM | POA: Diagnosis not present

## 2022-10-17 DIAGNOSIS — N401 Enlarged prostate with lower urinary tract symptoms: Secondary | ICD-10-CM | POA: Diagnosis not present

## 2022-10-17 DIAGNOSIS — Z1339 Encounter for screening examination for other mental health and behavioral disorders: Secondary | ICD-10-CM | POA: Diagnosis not present

## 2022-10-17 DIAGNOSIS — I2510 Atherosclerotic heart disease of native coronary artery without angina pectoris: Secondary | ICD-10-CM | POA: Diagnosis not present

## 2022-10-17 DIAGNOSIS — E785 Hyperlipidemia, unspecified: Secondary | ICD-10-CM | POA: Diagnosis not present

## 2022-10-17 DIAGNOSIS — D84821 Immunodeficiency due to drugs: Secondary | ICD-10-CM | POA: Diagnosis not present

## 2022-10-17 DIAGNOSIS — Z1331 Encounter for screening for depression: Secondary | ICD-10-CM | POA: Diagnosis not present

## 2022-10-17 DIAGNOSIS — M069 Rheumatoid arthritis, unspecified: Secondary | ICD-10-CM | POA: Diagnosis not present

## 2022-11-14 DIAGNOSIS — I2510 Atherosclerotic heart disease of native coronary artery without angina pectoris: Secondary | ICD-10-CM | POA: Diagnosis not present

## 2022-11-14 DIAGNOSIS — J029 Acute pharyngitis, unspecified: Secondary | ICD-10-CM | POA: Diagnosis not present

## 2022-11-14 DIAGNOSIS — I10 Essential (primary) hypertension: Secondary | ICD-10-CM | POA: Diagnosis not present

## 2022-11-14 DIAGNOSIS — R5383 Other fatigue: Secondary | ICD-10-CM | POA: Diagnosis not present

## 2022-11-14 DIAGNOSIS — Z1152 Encounter for screening for COVID-19: Secondary | ICD-10-CM | POA: Diagnosis not present

## 2022-11-14 DIAGNOSIS — M069 Rheumatoid arthritis, unspecified: Secondary | ICD-10-CM | POA: Diagnosis not present

## 2022-11-14 DIAGNOSIS — R059 Cough, unspecified: Secondary | ICD-10-CM | POA: Diagnosis not present

## 2022-11-14 DIAGNOSIS — U071 COVID-19: Secondary | ICD-10-CM | POA: Diagnosis not present

## 2022-11-14 DIAGNOSIS — R0981 Nasal congestion: Secondary | ICD-10-CM | POA: Diagnosis not present

## 2022-11-14 DIAGNOSIS — D84821 Immunodeficiency due to drugs: Secondary | ICD-10-CM | POA: Diagnosis not present

## 2022-12-06 ENCOUNTER — Other Ambulatory Visit: Payer: Self-pay | Admitting: *Deleted

## 2022-12-06 DIAGNOSIS — Z79899 Other long term (current) drug therapy: Secondary | ICD-10-CM

## 2022-12-07 LAB — COMPLETE METABOLIC PANEL WITH GFR
AG Ratio: 1.8 (calc) (ref 1.0–2.5)
ALT: 9 U/L (ref 9–46)
AST: 20 U/L (ref 10–35)
Albumin: 3.9 g/dL (ref 3.6–5.1)
Alkaline phosphatase (APISO): 91 U/L (ref 35–144)
BUN: 14 mg/dL (ref 7–25)
CO2: 28 mmol/L (ref 20–32)
Calcium: 9.4 mg/dL (ref 8.6–10.3)
Chloride: 108 mmol/L (ref 98–110)
Creat: 0.93 mg/dL (ref 0.70–1.22)
Globulin: 2.2 g/dL (calc) (ref 1.9–3.7)
Glucose, Bld: 71 mg/dL (ref 65–99)
Potassium: 4.3 mmol/L (ref 3.5–5.3)
Sodium: 142 mmol/L (ref 135–146)
Total Bilirubin: 0.8 mg/dL (ref 0.2–1.2)
Total Protein: 6.1 g/dL (ref 6.1–8.1)
eGFR: 82 mL/min/{1.73_m2} (ref >=60)

## 2022-12-07 LAB — CBC WITH DIFFERENTIAL/PLATELET
Absolute Monocytes: 476 cells/uL (ref 200–950)
Basophils Absolute: 29 cells/uL (ref 0–200)
Basophils Relative: 0.7 %
Eosinophils Absolute: 168 cells/uL (ref 15–500)
Eosinophils Relative: 4.1 %
HCT: 40.1 % (ref 38.5–50.0)
Hemoglobin: 13.3 g/dL (ref 13.2–17.1)
Lymphs Abs: 832 cells/uL — ABNORMAL LOW (ref 850–3900)
MCH: 31.7 pg (ref 27.0–33.0)
MCHC: 33.2 g/dL (ref 32.0–36.0)
MCV: 95.7 fL (ref 80.0–100.0)
MPV: 12.3 fL (ref 7.5–12.5)
Monocytes Relative: 11.6 %
Neutro Abs: 2595 cells/uL (ref 1500–7800)
Neutrophils Relative %: 63.3 %
Platelets: 141 10*3/uL (ref 140–400)
RBC: 4.19 10*6/uL — ABNORMAL LOW (ref 4.20–5.80)
RDW: 12.7 % (ref 11.0–15.0)
Total Lymphocyte: 20.3 %
WBC: 4.1 10*3/uL (ref 3.8–10.8)

## 2022-12-07 NOTE — Progress Notes (Signed)
CMP WNL.  RBC count is borderline low.  Absolute lymphocytes are borderline low. Rest of CBC WNL.  We will continue to monitor lab work closely every 3 months

## 2022-12-14 ENCOUNTER — Other Ambulatory Visit: Payer: Self-pay | Admitting: Rheumatology

## 2022-12-14 NOTE — Telephone Encounter (Signed)
Last Fill: 08/02/2022  Labs: 12/06/2022 CMP WNL. RBC count is borderline low.  Absolute lymphocytes are borderline low. Rest of CBC WNL.  We will continue to monitor lab work closely every 3 months  Next Visit: 02/08/2023  Last Visit: 08/02/2022  DX: Rheumatoid arthritis of multiple sites with negative rheumatoid factor   Current Dose per office note 08/02/2022: Methotrexate 3 tablets by mouth every week   Okay to refill Methotrexate?

## 2023-01-25 NOTE — Progress Notes (Signed)
Office Visit Note  Patient: Carlos Patton             Date of Birth: 05/04/1941           MRN: [id number]             PCP: Alysia Penna, MD Referring: Alysia Penna, MD Visit Date: 02/08/2023 Occupation: @GUAROCC @  Subjective:  Unstable gait  History of Present Illness: Carlos Patton is a 82 y.o. male with rheumatoid arthritis, osteoarthritis and degenerative disc disease of the lumbar spine.  He states recently has been having instability in his gait.  He believes is because of lower back discomfort and scoliosis.  He denies any joint pain.  He has been taking methotrexate 3 tablets p.o. weekly along with folic acid 1 mg p.o. daily without any side effects.  He denies any interruption in the treatment.  He states he has been very active.    Activities of Daily Living:  Patient reports morning stiffness for 30 minutes.   Patient Denies nocturnal pain.  Difficulty dressing/grooming: Denies Difficulty climbing stairs: Denies Difficulty getting out of chair: Denies Difficulty using hands for taps, buttons, cutlery, and/or writing: Denies  Review of Systems  Constitutional:  Positive for fatigue.  HENT:  Negative for mouth sores and mouth dryness.   Eyes:  Negative for dryness.  Respiratory:  Negative for shortness of breath.   Cardiovascular:  Negative for chest pain and palpitations.  Gastrointestinal:  Negative for blood in stool, constipation and diarrhea.  Endocrine: Negative for increased urination.  Genitourinary:  Negative for involuntary urination.  Musculoskeletal:  Positive for muscle weakness and morning stiffness. Negative for joint pain, gait problem, joint pain, joint swelling, myalgias, muscle tenderness and myalgias.  Skin:  Negative for color change, rash, hair loss and sensitivity to sunlight.  Allergic/Immunologic: Negative for susceptible to infections.  Neurological:  Positive for dizziness. Negative for headaches.  Hematological:  Negative for  swollen glands.  Psychiatric/Behavioral:  Negative for depressed mood and sleep disturbance. The patient is not nervous/anxious.     PMFS History:  Patient Active Problem List   Diagnosis Date Noted   Sepsis (HCC) 09/12/2018   CAP (community acquired pneumonia) 09/12/2018   Hypertension    Rheumatoid arthritis of multiple sites with negative rheumatoid factor (HCC) 03/07/2018   Rheumatoid nodules (HCC) 03/07/2018   High risk medication use 03/07/2018   History of asthma 03/07/2018   History of hypertension 03/07/2018   History of hypercholesterolemia 03/07/2018   History of coronary artery disease 03/07/2018   Benign prostatic hyperplasia with urinary obstruction 06/24/2016   Elevated prostate specific antigen (PSA) 07/28/2011   Coronary atherosclerosis 04/27/2010   NONSPECIFIC ABNORMAL FINDING IN STOOL CONTENTS 04/27/2010   PERSONAL HISTORY OF COLONIC POLYPS 04/27/2010    Past Medical History:  Diagnosis Date   Allergy    Arthritis    Asthma    oc flare in the past    CAP (community acquired pneumonia)    Cataract    Heart murmur    History of colon polyps    Hyperlipidemia    Hypertension    Myocardial infarction (HCC)    2 MI--1980's, 1999   Sepsis (HCC)     Family History  Problem Relation Age of Onset   Colon cancer Sister    Arthritis Mother    Stroke Father    Heart attack Father    Heart Problems Brother    Heart Problems Brother    Gout Son  Colon polyps Neg Hx    Esophageal cancer Neg Hx    Rectal cancer Neg Hx    Stomach cancer Neg Hx    Past Surgical History:  Procedure Laterality Date   CARDIAC CATHETERIZATION     CATARACT EXTRACTION Bilateral    COLONOSCOPY     CORONARY STENT PLACEMENT     INGUINAL HERNIA REPAIR Bilateral    POLYPECTOMY     Social History   Social History Narrative   Not on file   Immunization History  Administered Date(s) Administered   Moderna Sars-Covid-2 Vaccination 10/25/2019, 11/22/2019, 08/23/2020      Objective: Vital Signs: BP 131/85 (BP Location: Left Arm, Patient Position: Sitting, Cuff Size: Normal)   Pulse 65   Resp 13   Ht 6\' 2"  (1.88 m)   Wt 204 lb 6.4 oz (92.7 kg)   BMI 26.24 kg/m    Physical Exam Vitals and nursing note reviewed.  Constitutional:      Appearance: He is well-developed.  HENT:     Head: Normocephalic and atraumatic.  Eyes:     Conjunctiva/sclera: Conjunctivae normal.     Pupils: Pupils are equal, round, and reactive to light.  Cardiovascular:     Rate and Rhythm: Normal rate and regular rhythm.     Heart sounds: Normal heart sounds.  Pulmonary:     Effort: Pulmonary effort is normal.     Breath sounds: Normal breath sounds.  Abdominal:     General: Bowel sounds are normal.     Palpations: Abdomen is soft.  Musculoskeletal:     Cervical back: Normal range of motion and neck supple.  Skin:    General: Skin is warm and dry.     Capillary Refill: Capillary refill takes less than 2 seconds.     Comments: Rheumatoid nodules were noted over the left index and middle finger  Neurological:     Mental Status: He is alert and oriented to person, place, and time.  Psychiatric:        Behavior: Behavior normal.      Musculoskeletal Exam: Cervical spine was in good range of motion.  Thoracic kyphosis was noted.  Lumbar scoliosis with no point tenderness was noted.  Shoulder joints, elbow joints, wrist joints were in good range of motion.  He had no synovitis over MCPs or PIPs.  He had bilateral PIP and DIP thickening.  Nodulosis was noted over his left index and middle finger which is unchanged.  Hip joints and knee joints were in good range of motion without any warmth swelling or effusion.  There was no tenderness over ankles or MTPs.  CDAI Exam: CDAI Score: -- Patient Global: 3 mm; Provider Global: 3 mm Swollen: --; Tender: -- Joint Exam 02/08/2023   No joint exam has been documented for this visit   There is currently no information documented on  the homunculus. Go to the Rheumatology activity and complete the homunculus joint exam.  Investigation: No additional findings.  Imaging: No results found.  Recent Labs: Lab Results  Component Value Date   WBC 4.1 12/06/2022   HGB 13.3 12/06/2022   PLT 141 12/06/2022   NA 142 12/06/2022   K 4.3 12/06/2022   CL 108 12/06/2022   CO2 28 12/06/2022   GLUCOSE 71 12/06/2022   BUN 14 12/06/2022   CREATININE 0.93 12/06/2022   BILITOT 0.8 12/06/2022   ALKPHOS 107 04/18/2019   AST 20 12/06/2022   ALT 9 12/06/2022   PROT 6.1 12/06/2022  ALBUMIN 4.2 04/18/2019   CALCIUM 9.4 12/06/2022   GFRAA 63 02/02/2021    Speciality Comments: No specialty comments available.  Procedures:  No procedures performed Allergies: Patient has no known allergies.   Assessment / Plan:     Visit Diagnoses: Rheumatoid arthritis of multiple sites with negative rheumatoid factor (HCC) - +CCP with nodulosis: He had no active synovitis on the examination.  He continues to have some stiffness in his hands due to underlying osteoarthritis.  PIP and DIP thickening was noted bilaterally.  High risk medication use - Methotrexate 3 tablets by mouth every week, folic acid 1 mg  by mouth daily.  Labs obtained in March 2024 CBC and CMP were normal.  He was advised to get labs and June and every 3 months to monitor for drug toxicity.  Information on immunization was placed in the AVS.  He was advised to hold methotrexate if he develops an infection and resume after the infection resolves.  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  DDD (degenerative disc disease), lumbar - X-rays obtained in November 2019 showed multilevel spondylosis with scoliosis and facet joint arthropathy.  He denies any discomfort.  He has significant scoliosis.  He has been having some instability in his gait.  I offered referral to orthopedics or neurology which she declined.  I gave him a handout on lower extremity muscle  strengthening exercises.  I advised him to contact us if his symptoms persist or get worse.  Patient believes his symptoms are from prolonged sitting and stiffness.  Other medical problems are listed as follows:  Other idiopathic scoliosis, lumbar region  History of hypercholesterolemia  History of coronary artery disease  History of hypertension-blood pressure was normal  History of asthma  Orders: No orders of the defined types were placed in this encounter.  Meds ordered this encounter  Medications   methotrexate (RHEUMATREX) 2.5 MG tablet    Sig: TAKE 3 TABLETS BY MOUTH ONCE WEEKLY. Caution:Chemotherapy. Protect from light.    Dispense:  36 tablet    Refill:  0     Follow-Up Instructions: Return in about 5 months (around 07/11/2023) for Rheumatoid arthritis.   Pollyann Savoy, MD  Note - This record has been created using Animal nutritionist.  Chart creation errors have been sought, but may not always  have been located. Such creation errors do not reflect on  the standard of medical care.

## 2023-02-08 ENCOUNTER — Ambulatory Visit: Payer: PPO | Attending: Rheumatology | Admitting: Rheumatology

## 2023-02-08 ENCOUNTER — Encounter: Payer: Self-pay | Admitting: Rheumatology

## 2023-02-08 VITALS — BP 131/85 | HR 65 | Resp 13 | Ht 74.0 in | Wt 204.4 lb

## 2023-02-08 DIAGNOSIS — Z8639 Personal history of other endocrine, nutritional and metabolic disease: Secondary | ICD-10-CM

## 2023-02-08 DIAGNOSIS — Z8709 Personal history of other diseases of the respiratory system: Secondary | ICD-10-CM

## 2023-02-08 DIAGNOSIS — Z79899 Other long term (current) drug therapy: Secondary | ICD-10-CM

## 2023-02-08 DIAGNOSIS — M4126 Other idiopathic scoliosis, lumbar region: Secondary | ICD-10-CM | POA: Diagnosis not present

## 2023-02-08 DIAGNOSIS — M51369 Other intervertebral disc degeneration, lumbar region without mention of lumbar back pain or lower extremity pain: Secondary | ICD-10-CM

## 2023-02-08 DIAGNOSIS — Z8679 Personal history of other diseases of the circulatory system: Secondary | ICD-10-CM | POA: Diagnosis not present

## 2023-02-08 DIAGNOSIS — M0609 Rheumatoid arthritis without rheumatoid factor, multiple sites: Secondary | ICD-10-CM

## 2023-02-08 DIAGNOSIS — M5136 Other intervertebral disc degeneration, lumbar region: Secondary | ICD-10-CM | POA: Diagnosis not present

## 2023-02-08 DIAGNOSIS — M0630 Rheumatoid nodule, unspecified site: Secondary | ICD-10-CM | POA: Diagnosis not present

## 2023-02-08 MED ORDER — METHOTREXATE SODIUM 2.5 MG PO TABS: ORAL_TABLET | ORAL | 0 refills | Status: DC

## 2023-02-08 NOTE — Patient Instructions (Signed)
Standing Labs We placed an order today for your standing lab work.   Please have your standing labs drawn in June and every 3 months  Please have your labs drawn 2 weeks prior to your appointment so that the provider can discuss your lab results at your appointment, if possible.  Please note that you may see your imaging and lab results in MyChart before we have reviewed them. We will contact you once all results are reviewed. Please allow our office up to 72 hours to thoroughly review all of the results before contacting the office for clarification of your results.  WALK-IN LAB HOURS  Monday through Thursday from 8:00 am -12:30 pm and 1:00 pm-5:00 pm and Friday from 8:00 am-12:00 pm.  Patients with office visits requiring labs will be seen before walk-in labs.  You may encounter longer than normal wait times. Please allow additional time. Wait times may be shorter on  Monday and Thursday afternoons.  We do not book appointments for walk-in labs. We appreciate your patience and understanding with our staff.   Labs are drawn by [facility]. Please bring your co-pay at the time of your lab draw.  You may receive a bill from [facility] for your lab work.  Please note if you are on Hydroxychloroquine and and an order has been placed for a Hydroxychloroquine level,  you will need to have it drawn 4 hours or more after your last dose.  If you wish to have your labs drawn at another location, please call the office 24 hours in advance so we can fax the orders.  The office is located at [address], [address], [place], [place] [address]   If you have any questions regarding directions or hours of operation,  please call [phone].   As a reminder, please drink plenty of water prior to coming for your lab work. Thanks!   Vaccines You are taking a medication(s) that can suppress your immune system.  The following immunizations are recommended: Flu annually Covid-19  Td/Tdap (tetanus, diphtheria,  pertussis) every 10 years Pneumonia (Prevnar 15 then Pneumovax 23 at least 1 year apart.  Alternatively, can take Prevnar 20 without needing additional dose) Shingrix: 2 doses from 4 weeks to 6 months apart  Please check with your PCP to make sure you are up to date.   If you have signs or symptoms of an infection or start antibiotics: First, call your PCP for workup of your infection. Hold your medication through the infection, until you complete your antibiotics, and until symptoms resolve if you take the following: Injectable medication (Actemra, Benlysta, Cimzia, Cosentyx, Enbrel, Humira, Kevzara, Orencia, Remicade, Simponi, Stelara, Taltz, Tremfya) Methotrexate Leflunomide (Arava) Mycophenolate (Cellcept) [place], or Rinvoq   Exercises for Chronic Knee Pain Chronic knee pain is pain that lasts longer than 3 months. For most people with chronic knee pain, exercise and weight loss is an important part of treatment. Your health care provider may want you to focus on: Strengthening the muscles that support your knee. This can take pressure off your knee and lessen pain. Preventing knee stiffness. Maintaining or increasing how far you can move your knee. Losing weight (if this applies) to take pressure off your knee, decrease your risk for injury, and make it easier for you to exercise. Your health care provider will help you develop an exercise program that matches your needs and physical abilities. Below are simple, low-impact exercises you can do at home. Ask your health care provider or a  physical therapist how often you should do your exercise program and how many times to repeat each exercise. General safety tips Follow these safety tips for exercising with chronic knee pain: Get your health care provider's approval before doing any exercises. Start slowly and stop any time an exercise causes pain. Do not exercise if your knee pain is flaring up. Warm up first. Stretching a  cold muscle can cause an injury. Do 5-10 minutes of easy movement or light stretching before beginning your exercise routine. Do 5-10 minutes of low-impact activity (like walking or cycling) before starting strengthening exercises. Contact your health care provider any time you have pain during or after exercising. Exercise may cause discomfort but should not be painful. It is normal to be a little stiff or sore after exercising.  Stretching and range-of-motion exercises Front thigh stretch  Stand up straight and support your body by holding on to a chair or resting one hand on a wall. With your legs straight and close together, bend one knee to lift your heel up toward your buttocks. Using one hand for support, grab your ankle with your free hand. Pull your foot up closer toward your buttocks to feel the stretch in front of your thigh. Hold the stretch for 30 seconds. Repeat __________ times. Complete this exercise __________ times a day. Back thigh stretch  Sit on the floor with your back straight and your legs out straight in front of you. Place the palms of your hands on the floor and slide them toward your feet as you bend at the hip. Try to touch your nose to your knees and feel the stretch in the back of your thighs. Hold for 30 seconds. Repeat __________ times. Complete this exercise __________ times a day. Calf stretch  Stand facing a wall. Place the palms of your hands flat against the wall, arms extended, and lean slightly against the wall. Get into a lunge position with one leg bent at the knee and the other leg stretched out straight behind you. Keep both feet facing the wall and increase the bend in your knee while keeping the heel of the other leg flat on the ground. You should feel the stretch in your calf. Hold for 30 seconds. Repeat __________ times. Complete this exercise __________ times a day. Strengthening exercises Straight leg lift Lie on your back with one knee  bent and the other leg out straight. Slowly lift the straight leg without bending the knee. Lift until your foot is about 12 inches (30 cm) off the floor. Hold for 3-5 seconds and slowly lower your leg. Repeat __________ times. Complete this exercise __________ times a day. Single leg dip Stand between two chairs and put both hands on the backs of the chairs for support. Extend one leg out straight with your body weight resting on the heel of the standing leg. Slowly bend your standing knee to dip your body to the level that is comfortable for you. Hold for 3-5 seconds. Repeat __________ times. Complete this exercise __________ times a day. Hamstring curls Stand straight, knees close together, facing the back of a chair. Hold on to the back of a chair with both hands. Keep one leg straight. Bend the other knee while bringing the heel up toward the buttock until the knee is bent at a 90-degree angle (right angle). Hold for 3-5 seconds. Repeat __________ times. Complete this exercise __________ times a day. Wall squat Stand straight with your back, hips, and head against a  wall. Step forward one foot at a time with your back still against the wall. Your feet should be 2 feet (61 cm) from the wall at shoulder width. Keeping your back, hips, and head against the wall, slide down the wall to as close of a sitting position as you can get. Hold for 5-10 seconds, then slowly slide back up. Repeat __________ times. Complete this exercise __________ times a day. Step-ups Step up with one foot onto a sturdy platform or stool that is about 6 inches (15 cm) high. Face sideways with one foot on the platform and one on the ground. Place all your weight on the platform foot and lift your body off the ground until your knee extends. Let your other leg hang free to the side. Hold for 3-5 seconds then slowly lower your weight down to the floor foot. Repeat __________ times. Complete this exercise  __________ times a day. Contact a health care provider if: Your exercise causes pain. Your pain is worse after you exercise. Your pain prevents you from doing your exercises. This information is not intended to replace advice given to you by your health care provider. Make sure you discuss any questions you have with your health care provider. Document Revised: 01/09/2020 Document Reviewed: 09/02/2019 Elsevier Patient Education  2023 [employer].

## 2023-03-31 ENCOUNTER — Other Ambulatory Visit: Payer: Self-pay | Admitting: *Deleted

## 2023-03-31 DIAGNOSIS — Z79899 Other long term (current) drug therapy: Secondary | ICD-10-CM | POA: Diagnosis not present

## 2023-03-31 LAB — CBC WITH DIFFERENTIAL/PLATELET
Absolute Monocytes: 589 cells/uL (ref 200–950)
Basophils Absolute: 28 cells/uL (ref 0–200)
Basophils Relative: 0.5 %
Eosinophils Relative: 3.5 %
HCT: 42.3 % (ref 38.5–50.0)
MCH: 31.5 pg (ref 27.0–33.0)
MPV: 12 fL (ref 7.5–12.5)
Monocytes Relative: 10.7 %
Neutro Abs: 3768 cells/uL (ref 1500–7800)
Platelets: 141 10*3/uL (ref 140–400)
RBC: 4.41 10*6/uL (ref 4.20–5.80)
Total Lymphocyte: 16.8 %
WBC: 5.5 10*3/uL (ref 3.8–10.8)

## 2023-04-01 LAB — COMPLETE METABOLIC PANEL WITH GFR
AG Ratio: 1.8 (calc) (ref 1.0–2.5)
ALT: 10 U/L (ref 9–46)
AST: 19 U/L (ref 10–35)
Albumin: 4 g/dL (ref 3.6–5.1)
Alkaline phosphatase (APISO): 109 U/L (ref 35–144)
BUN: 20 mg/dL (ref 7–25)
CO2: 27 mmol/L (ref 20–32)
Calcium: 9.3 mg/dL (ref 8.6–10.3)
Chloride: 107 mmol/L (ref 98–110)
Creat: 1.07 mg/dL (ref 0.70–1.22)
Globulin: 2.2 g/dL (calc) (ref 1.9–3.7)
Glucose, Bld: 83 mg/dL (ref 65–99)
Potassium: 4.1 mmol/L (ref 3.5–5.3)
Sodium: 141 mmol/L (ref 135–146)
Total Bilirubin: 0.6 mg/dL (ref 0.2–1.2)
Total Protein: 6.2 g/dL (ref 6.1–8.1)
eGFR: 69 mL/min/{1.73_m2} (ref >=60)

## 2023-04-01 LAB — CBC WITH DIFFERENTIAL/PLATELET
Eosinophils Absolute: 193 cells/uL (ref 15–500)
Hemoglobin: 13.9 g/dL (ref 13.2–17.1)
Lymphs Abs: 924 cells/uL (ref 850–3900)
MCHC: 32.9 g/dL (ref 32.0–36.0)
MCV: 95.9 fL (ref 80.0–100.0)
Neutrophils Relative %: 68.5 %
RDW: 12.9 % (ref 11.0–15.0)

## 2023-04-03 NOTE — Progress Notes (Signed)
 CBC and CMP WNL

## 2023-05-25 ENCOUNTER — Other Ambulatory Visit: Payer: Self-pay | Admitting: Rheumatology

## 2023-05-25 NOTE — Telephone Encounter (Signed)
Last Fill: 02/08/2023  Labs: 03/31/2023 CBC and CMP WNL   Next Visit: 08/10/2023  Last Visit: 02/08/2023  DX: Rheumatoid arthritis of multiple sites with negative rheumatoid factor   Current Dose per office note 02/08/2023: Methotrexate 3 tablets by mouth every week   Okay to refill Methotrexate?

## 2023-06-19 ENCOUNTER — Other Ambulatory Visit: Payer: Self-pay

## 2023-06-19 DIAGNOSIS — Z79899 Other long term (current) drug therapy: Secondary | ICD-10-CM

## 2023-06-20 LAB — COMPLETE METABOLIC PANEL WITH GFR
AG Ratio: 1.9 (calc) (ref 1.0–2.5)
ALT: 10 U/L (ref 9–46)
AST: 22 U/L (ref 10–35)
Albumin: 3.9 g/dL (ref 3.6–5.1)
Alkaline phosphatase (APISO): 92 U/L (ref 35–144)
BUN: 15 mg/dL (ref 7–25)
CO2: 28 mmol/L (ref 20–32)
Calcium: 9.3 mg/dL (ref 8.6–10.3)
Chloride: 106 mmol/L (ref 98–110)
Creat: 0.92 mg/dL (ref 0.70–1.22)
Globulin: 2.1 g/dL (ref 1.9–3.7)
Glucose, Bld: 86 mg/dL (ref 65–99)
Potassium: 4 mmol/L (ref 3.5–5.3)
Sodium: 140 mmol/L (ref 135–146)
Total Bilirubin: 0.8 mg/dL (ref 0.2–1.2)
Total Protein: 6 g/dL — ABNORMAL LOW (ref 6.1–8.1)
eGFR: 83 mL/min/{1.73_m2} (ref >=60)

## 2023-06-20 LAB — CBC WITH DIFFERENTIAL/PLATELET
Absolute Monocytes: 593 {cells}/uL (ref 200–950)
Basophils Absolute: 20 {cells}/uL (ref 0–200)
Basophils Relative: 0.4 %
Eosinophils Absolute: 230 {cells}/uL (ref 15–500)
Eosinophils Relative: 4.7 %
HCT: 42.5 % (ref 38.5–50.0)
Hemoglobin: 13.6 g/dL (ref 13.2–17.1)
Lymphs Abs: 921 {cells}/uL (ref 850–3900)
MCH: 31.6 pg (ref 27.0–33.0)
MCHC: 32 g/dL (ref 32.0–36.0)
MCV: 98.8 fL (ref 80.0–100.0)
MPV: 12 fL (ref 7.5–12.5)
Monocytes Relative: 12.1 %
Neutro Abs: 3136 {cells}/uL (ref 1500–7800)
Neutrophils Relative %: 64 %
Platelets: 151 10*3/uL (ref 140–400)
RBC: 4.3 10*6/uL (ref 4.20–5.80)
RDW: 12.8 % (ref 11.0–15.0)
Total Lymphocyte: 18.8 %
WBC: 4.9 10*3/uL (ref 3.8–10.8)

## 2023-06-20 NOTE — Progress Notes (Signed)
CBC WNL Total protein borderline low. Rest of CMP WNL.

## 2023-07-28 NOTE — Progress Notes (Signed)
Office Visit Note  Patient: Carlos Patton             Date of Birth: 1941/01/13           MRN: [id number]             PCP: Alysia Penna, MD Referring: Alysia Penna, MD Visit Date: 08/10/2023 Occupation: @GUAROCC @  Subjective:  Medication  History of Present Illness: Carlos Patton is a 82 y.o. male with seropositive rheumatoid arthritis, osteoarthritis, and degenerative disc disease.  He has been taking methotrexate 3 tablets p.o. weekly along with folic acid without any interruption.  He denies having flare of rheumatoid arthritis.  He has intermittent discomfort in his lower back.    Activities of Daily Living:  Patient reports morning stiffness for a few minutes.   Patient Denies nocturnal pain.  Difficulty dressing/grooming: Denies Difficulty climbing stairs: Denies Difficulty getting out of chair: Denies Difficulty using hands for taps, buttons, cutlery, and/or writing: Denies  Review of Systems  Constitutional:  Negative for fatigue.  HENT:  Negative for mouth sores and mouth dryness.   Eyes:  Negative for dryness.  Respiratory:  Negative for shortness of breath.   Cardiovascular:  Negative for chest pain and palpitations.  Gastrointestinal:  Negative for blood in stool, constipation and diarrhea.  Endocrine: Negative for increased urination.  Genitourinary:  Negative for involuntary urination.  Musculoskeletal:  Positive for morning stiffness. Negative for joint pain, gait problem, joint pain, joint swelling, myalgias, muscle weakness, muscle tenderness and myalgias.  Skin:  Negative for color change, rash, hair loss and sensitivity to sunlight.  Allergic/Immunologic: Negative for susceptible to infections.  Neurological:  Negative for dizziness and headaches.  Hematological:  Negative for swollen glands.  Psychiatric/Behavioral:  Negative for depressed mood and sleep disturbance. The patient is not nervous/anxious.     PMFS History:  Patient Active  Problem List   Diagnosis Date Noted   Sepsis (HCC) 09/12/2018   CAP (community acquired pneumonia) 09/12/2018   Hypertension    Rheumatoid arthritis of multiple sites with negative rheumatoid factor (HCC) 03/07/2018   Rheumatoid nodules (HCC) 03/07/2018   High risk medication use 03/07/2018   History of asthma 03/07/2018   History of hypertension 03/07/2018   History of hypercholesterolemia 03/07/2018   History of coronary artery disease 03/07/2018   Benign prostatic hyperplasia with urinary obstruction 06/24/2016   Elevated prostate specific antigen (PSA) 07/28/2011   Coronary atherosclerosis 04/27/2010   NONSPECIFIC ABNORMAL FINDING IN STOOL CONTENTS 04/27/2010   History of colonic polyps 04/27/2010    Past Medical History:  Diagnosis Date   Allergy    Arthritis    Asthma    oc flare in the past    CAP (community acquired pneumonia)    Cataract    Heart murmur    History of colon polyps    Hyperlipidemia    Hypertension    Myocardial infarction (HCC)    2 MI--1980's, 1999   Sepsis (HCC)     Family History  Problem Relation Age of Onset   Colon cancer Sister    Arthritis Mother    Stroke Father    Heart attack Father    Heart Problems Brother    Heart Problems Brother    Gout Son    Colon polyps Neg Hx    Esophageal cancer Neg Hx    Rectal cancer Neg Hx    Stomach cancer Neg Hx    Past Surgical History:  Procedure Laterality Date  CARDIAC CATHETERIZATION     CATARACT EXTRACTION Bilateral    COLONOSCOPY     CORONARY STENT PLACEMENT     INGUINAL HERNIA REPAIR Bilateral    POLYPECTOMY     Social History   Social History Narrative   Not on file   Immunization History  Administered Date(s) Administered   Moderna Sars-Covid-2 Vaccination 10/25/2019, 11/22/2019, 08/23/2020     Objective: Vital Signs: BP 136/77 (BP Location: Right Arm, Patient Position: Sitting, Cuff Size: Normal)   Pulse 66   Resp 14   Ht 6\' 2"  (1.88 m)   Wt 206 lb 3.2 oz (93.5 kg)    BMI 26.47 kg/m    Physical Exam Vitals and nursing note reviewed.  Constitutional:      Appearance: He is well-developed.  HENT:     Head: Normocephalic and atraumatic.  Eyes:     Conjunctiva/sclera: Conjunctivae normal.     Pupils: Pupils are equal, round, and reactive to light.  Cardiovascular:     Rate and Rhythm: Normal rate and regular rhythm.     Heart sounds: Normal heart sounds.  Pulmonary:     Effort: Pulmonary effort is normal.     Breath sounds: Normal breath sounds.  Abdominal:     General: Bowel sounds are normal.     Palpations: Abdomen is soft.  Musculoskeletal:     Cervical back: Normal range of motion and neck supple.  Skin:    General: Skin is warm and dry.     Capillary Refill: Capillary refill takes less than 2 seconds.     Comments: Rheumatoid nodules were noted on his fingers.  Neurological:     Mental Status: He is alert and oriented to person, place, and time.  Psychiatric:        Behavior: Behavior normal.      Musculoskeletal Exam: Cervical spine was in good range of motion.  Thoracic kyphosis was noted.  Lumbar scoliosis was noted.  He had no discomfort with range of motion.  Shoulders, elbows, wrist joints, MCPs PIPs and DIPs with good range of motion.  No synovitis was noted.  Bilateral PIP and DIP thickening was noted.  Nodulosis was noted over left index and middle finger.  Hips and knee joints with good range of motion without any warmth swelling or effusion.  There was no tenderness over ankles or MTPs.  CDAI Exam: CDAI Score: -- Patient Global: 0 / 100; Provider Global: 0 / 100 Swollen: --; Tender: -- Joint Exam 08/10/2023   No joint exam has been documented for this visit   There is currently no information documented on the homunculus. Go to the Rheumatology activity and complete the homunculus joint exam.  Investigation: No additional findings.  Imaging: No results found.  Recent Labs: Lab Results  Component Value Date    WBC 4.9 06/19/2023   HGB 13.6 06/19/2023   PLT 151 06/19/2023   NA 140 06/19/2023   K 4.0 06/19/2023   CL 106 06/19/2023   CO2 28 06/19/2023   GLUCOSE 86 06/19/2023   BUN 15 06/19/2023   CREATININE 0.92 06/19/2023   BILITOT 0.8 06/19/2023   ALKPHOS 107 04/18/2019   AST 22 06/19/2023   ALT 10 06/19/2023   PROT 6.0 (L) 06/19/2023   ALBUMIN 4.2 04/18/2019   CALCIUM 9.3 06/19/2023   GFRAA 63 02/02/2021    Speciality Comments: No specialty comments available.  Procedures:  No procedures performed Allergies: Patient has no known allergies.   Assessment / Plan:  Visit Diagnoses: Rheumatoid arthritis of multiple sites with negative rheumatoid factor (HCC) - +CCP with nodulosis: Patient had no synovitis on the examination.  He denies having a rheumatoid arthritis flare.  He has been taking methotrexate 3 tablets p.o. weekly along with folic acid 1 mg p.o. daily without any interruption.  High risk medication use - Methotrexate 3 tablets by mouth every week, folic acid 1 mg  by mouth daily.  Labs obtained in September which included CBC and CMP were within normal limits.  He was advised to get labs every 3 months.  Information on immunization was placed in the AVS.  Rheumatoid nodules (HCC) - rheumatoid nodulosis is unchanged.  Not bothersome to patient.  Spondylosis of lumbar spine -he has intermittent discomfort in his lower back.  He denies any discomfort today.  X-rays obtained in November 2019 showed multilevel spondylosis with scoliosis and facet joint arthropathy.  Other idiopathic scoliosis, lumbar region  Other medical problems are listed as follows:  History of hypercholesterolemia  History of hypertension-blood pressure is normal today at 136/77.  History of coronary artery disease  History of asthma  Orders: No orders of the defined types were placed in this encounter.  No orders of the defined types were placed in this encounter.    Follow-Up Instructions:  Return in about 5 months (around 01/08/2024) for Rheumatoid arthritis.   Pollyann Savoy, MD  Note - This record has been created using Animal nutritionist.  Chart creation errors have been sought, but may not always  have been located. Such creation errors do not reflect on  the standard of medical care.

## 2023-07-31 DIAGNOSIS — H353232 Exudative age-related macular degeneration, bilateral, with inactive choroidal neovascularization: Secondary | ICD-10-CM | POA: Diagnosis not present

## 2023-07-31 DIAGNOSIS — D3132 Benign neoplasm of left choroid: Secondary | ICD-10-CM | POA: Diagnosis not present

## 2023-08-10 ENCOUNTER — Encounter: Payer: Self-pay | Admitting: Rheumatology

## 2023-08-10 ENCOUNTER — Ambulatory Visit: Payer: PPO | Attending: Rheumatology | Admitting: Rheumatology

## 2023-08-10 VITALS — BP 136/77 | HR 66 | Resp 14 | Ht 74.0 in | Wt 206.2 lb

## 2023-08-10 DIAGNOSIS — M0609 Rheumatoid arthritis without rheumatoid factor, multiple sites: Secondary | ICD-10-CM

## 2023-08-10 DIAGNOSIS — Z8639 Personal history of other endocrine, nutritional and metabolic disease: Secondary | ICD-10-CM

## 2023-08-10 DIAGNOSIS — M47816 Spondylosis without myelopathy or radiculopathy, lumbar region: Secondary | ICD-10-CM | POA: Diagnosis not present

## 2023-08-10 DIAGNOSIS — Z79899 Other long term (current) drug therapy: Secondary | ICD-10-CM | POA: Diagnosis not present

## 2023-08-10 DIAGNOSIS — M4126 Other idiopathic scoliosis, lumbar region: Secondary | ICD-10-CM | POA: Diagnosis not present

## 2023-08-10 DIAGNOSIS — Z8679 Personal history of other diseases of the circulatory system: Secondary | ICD-10-CM | POA: Diagnosis not present

## 2023-08-10 DIAGNOSIS — Z8709 Personal history of other diseases of the respiratory system: Secondary | ICD-10-CM

## 2023-08-10 DIAGNOSIS — M0630 Rheumatoid nodule, unspecified site: Secondary | ICD-10-CM

## 2023-08-10 NOTE — Patient Instructions (Addendum)
Standing Labs We placed an order today for your standing lab work.   Please have your standing labs drawn in December and every 3 months  Please have your labs drawn 2 weeks prior to your appointment so that the provider can discuss your lab results at your appointment, if possible.  Please note that you may see your imaging and lab results in MyChart before we have reviewed them. We will contact you once all results are reviewed. Please allow our office up to 72 hours to thoroughly review all of the results before contacting the office for clarification of your results.  WALK-IN LAB HOURS  Monday through Thursday from 8:00 am -12:30 pm and 1:00 pm-5:00 pm and Friday from 8:00 am-12:00 pm.  Patients with office visits requiring labs will be seen before walk-in labs.  You may encounter longer than normal wait times. Please allow additional time. Wait times may be shorter on  Monday and Thursday afternoons.  We do not book appointments for walk-in labs. We appreciate your patience and understanding with our staff.   Labs are drawn by [facility]. Please bring your co-pay at the time of your lab draw.  You may receive a bill from [facility] for your lab work.  Please note if you are on Hydroxychloroquine and and an order has been placed for a Hydroxychloroquine level,  you will need to have it drawn 4 hours or more after your last dose.  If you wish to have your labs drawn at another location, please call the office 24 hours in advance so we can fax the orders.  The office is located at [address], [place], [place] [address]   If you have any questions regarding directions or hours of operation,  please call [phone].   As a reminder, please drink plenty of water prior to coming for your lab work. Thanks!   Vaccines You are taking a medication(s) that can suppress your immune system.  The following immunizations are recommended: Flu annually Covid-19  Td/Tdap (tetanus,  diphtheria, pertussis) every 10 years Pneumonia (Prevnar 15 then Pneumovax 23 at least 1 year apart.  Alternatively, can take Prevnar 20 without needing additional dose) Shingrix: 2 doses from 4 weeks to 6 months apart  Please check with your PCP to make sure you are up to date. If you have signs or symptoms of an infection or start antibiotics: First, call your PCP for workup of your infection. Hold your medication through the infection, until you complete your antibiotics, and until symptoms resolve if you take the following: Injectable medication (Actemra, Benlysta, Cimzia, Cosentyx, Enbrel, Humira, Kevzara, Orencia, Remicade, Simponi, Stelara, Taltz, Tremfya) Methotrexate Leflunomide (Arava) Mycophenolate (Cellcept) Harriette Ohara, Olumiant, or Rinvoq

## 2023-08-14 ENCOUNTER — Other Ambulatory Visit: Payer: Self-pay | Admitting: Internal Medicine

## 2023-08-14 NOTE — Telephone Encounter (Signed)
Last Fill: 05/25/2023  Labs: 06/19/2023 CBC WNL Total protein borderline low. Rest of CMP WNL.  Next Visit: 01/11/2024  Last Visit: 08/10/2023  DX: Rheumatoid arthritis of multiple sites with negative rheumatoid factor   Current Dose per office note 08/10/2023: Methotrexate 3 tablets by mouth every week   Okay to refill Methotrexate?

## 2023-09-20 DIAGNOSIS — H3530 Unspecified macular degeneration: Secondary | ICD-10-CM

## 2023-09-20 HISTORY — DX: Unspecified macular degeneration: H35.30

## 2023-10-18 DIAGNOSIS — E785 Hyperlipidemia, unspecified: Secondary | ICD-10-CM | POA: Diagnosis not present

## 2023-10-18 DIAGNOSIS — I10 Essential (primary) hypertension: Secondary | ICD-10-CM | POA: Diagnosis not present

## 2023-10-18 DIAGNOSIS — E559 Vitamin D deficiency, unspecified: Secondary | ICD-10-CM | POA: Diagnosis not present

## 2023-10-18 DIAGNOSIS — I2510 Atherosclerotic heart disease of native coronary artery without angina pectoris: Secondary | ICD-10-CM | POA: Diagnosis not present

## 2023-10-24 DIAGNOSIS — D84821 Immunodeficiency due to drugs: Secondary | ICD-10-CM | POA: Diagnosis not present

## 2023-10-24 DIAGNOSIS — I700 Atherosclerosis of aorta: Secondary | ICD-10-CM | POA: Diagnosis not present

## 2023-10-24 DIAGNOSIS — Z23 Encounter for immunization: Secondary | ICD-10-CM | POA: Diagnosis not present

## 2023-10-24 DIAGNOSIS — Z0000 Encounter for general adult medical examination without abnormal findings: Secondary | ICD-10-CM | POA: Diagnosis not present

## 2023-10-24 DIAGNOSIS — Z1331 Encounter for screening for depression: Secondary | ICD-10-CM | POA: Diagnosis not present

## 2023-10-24 DIAGNOSIS — N401 Enlarged prostate with lower urinary tract symptoms: Secondary | ICD-10-CM | POA: Diagnosis not present

## 2023-10-24 DIAGNOSIS — Z1339 Encounter for screening examination for other mental health and behavioral disorders: Secondary | ICD-10-CM | POA: Diagnosis not present

## 2023-10-24 DIAGNOSIS — M069 Rheumatoid arthritis, unspecified: Secondary | ICD-10-CM | POA: Diagnosis not present

## 2023-10-24 DIAGNOSIS — I2510 Atherosclerotic heart disease of native coronary artery without angina pectoris: Secondary | ICD-10-CM | POA: Diagnosis not present

## 2023-10-24 DIAGNOSIS — D692 Other nonthrombocytopenic purpura: Secondary | ICD-10-CM | POA: Diagnosis not present

## 2023-10-24 DIAGNOSIS — E785 Hyperlipidemia, unspecified: Secondary | ICD-10-CM | POA: Diagnosis not present

## 2023-10-24 DIAGNOSIS — I10 Essential (primary) hypertension: Secondary | ICD-10-CM | POA: Diagnosis not present

## 2023-11-13 DIAGNOSIS — H353221 Exudative age-related macular degeneration, left eye, with active choroidal neovascularization: Secondary | ICD-10-CM | POA: Diagnosis not present

## 2023-11-14 DIAGNOSIS — D3132 Benign neoplasm of left choroid: Secondary | ICD-10-CM | POA: Diagnosis not present

## 2023-11-14 DIAGNOSIS — H353221 Exudative age-related macular degeneration, left eye, with active choroidal neovascularization: Secondary | ICD-10-CM | POA: Diagnosis not present

## 2023-11-14 DIAGNOSIS — H35371 Puckering of macula, right eye: Secondary | ICD-10-CM | POA: Diagnosis not present

## 2023-11-14 DIAGNOSIS — H353112 Nonexudative age-related macular degeneration, right eye, intermediate dry stage: Secondary | ICD-10-CM | POA: Diagnosis not present

## 2023-11-15 ENCOUNTER — Other Ambulatory Visit: Payer: Self-pay | Admitting: Physician Assistant

## 2023-11-15 DIAGNOSIS — Z79899 Other long term (current) drug therapy: Secondary | ICD-10-CM

## 2023-11-15 DIAGNOSIS — Z8639 Personal history of other endocrine, nutritional and metabolic disease: Secondary | ICD-10-CM

## 2023-11-15 NOTE — Telephone Encounter (Signed)
 Last Fill: 08/14/2023  Labs: 06/19/2023 CBC WNL Total protein borderline low. Rest of CMP WNL.  Next Visit: 01/11/2024  Last Visit: 08/10/2023  DX: Rheumatoid arthritis of multiple sites with negative rheumatoid factor   Current Dose per office note 08/10/2023: Methotrexate 3 tablets by mouth every week   Patient advised he is due to update his lab work. Patient states he will come on Tuesday to update.   Okay to refill Methotrexate?

## 2023-11-21 ENCOUNTER — Other Ambulatory Visit: Payer: Self-pay | Admitting: *Deleted

## 2023-11-21 DIAGNOSIS — Z8639 Personal history of other endocrine, nutritional and metabolic disease: Secondary | ICD-10-CM

## 2023-11-21 DIAGNOSIS — Z79899 Other long term (current) drug therapy: Secondary | ICD-10-CM | POA: Diagnosis not present

## 2023-11-21 DIAGNOSIS — H353221 Exudative age-related macular degeneration, left eye, with active choroidal neovascularization: Secondary | ICD-10-CM | POA: Diagnosis not present

## 2023-11-22 LAB — COMPLETE METABOLIC PANEL WITH GFR
AG Ratio: 2 (calc) (ref 1.0–2.5)
ALT: 11 U/L (ref 9–46)
AST: 19 U/L (ref 10–35)
Albumin: 4.1 g/dL (ref 3.6–5.1)
Alkaline phosphatase (APISO): 105 U/L (ref 35–144)
BUN: 19 mg/dL (ref 7–25)
CO2: 31 mmol/L (ref 20–32)
Calcium: 9.7 mg/dL (ref 8.6–10.3)
Chloride: 100 mmol/L (ref 98–110)
Creat: 1.05 mg/dL (ref 0.70–1.22)
Globulin: 2.1 g/dL (ref 1.9–3.7)
Glucose, Bld: 116 mg/dL — ABNORMAL HIGH (ref 65–99)
Potassium: 4.1 mmol/L (ref 3.5–5.3)
Sodium: 139 mmol/L (ref 135–146)
Total Bilirubin: 0.7 mg/dL (ref 0.2–1.2)
Total Protein: 6.2 g/dL (ref 6.1–8.1)
eGFR: 70 mL/min/{1.73_m2} (ref >=60)

## 2023-11-22 LAB — CBC WITH DIFFERENTIAL/PLATELET
Absolute Lymphocytes: 766 {cells}/uL — ABNORMAL LOW (ref 850–3900)
Absolute Monocytes: 505 {cells}/uL (ref 200–950)
Basophils Absolute: 17 {cells}/uL (ref 0–200)
Basophils Relative: 0.3 %
Eosinophils Absolute: 151 {cells}/uL (ref 15–500)
Eosinophils Relative: 2.6 %
HCT: 41.9 % (ref 38.5–50.0)
Hemoglobin: 14 g/dL (ref 13.2–17.1)
MCH: 32.3 pg (ref 27.0–33.0)
MCHC: 33.4 g/dL (ref 32.0–36.0)
MCV: 96.5 fL (ref 80.0–100.0)
MPV: 11.5 fL (ref 7.5–12.5)
Monocytes Relative: 8.7 %
Neutro Abs: 4362 {cells}/uL (ref 1500–7800)
Neutrophils Relative %: 75.2 %
Platelets: 166 10*3/uL (ref 140–400)
RBC: 4.34 10*6/uL (ref 4.20–5.80)
RDW: 12.5 % (ref 11.0–15.0)
Total Lymphocyte: 13.2 %
WBC: 5.8 10*3/uL (ref 3.8–10.8)

## 2023-11-22 LAB — LIPID PANEL
Cholesterol: 179 mg/dL (ref <200)
HDL: 47 mg/dL (ref >=40)
LDL Cholesterol (Calc): 107 mg/dL — ABNORMAL HIGH
Non-HDL Cholesterol (Calc): 132 mg/dL — ABNORMAL HIGH (ref <130)
Total CHOL/HDL Ratio: 3.8 (calc) (ref <5.0)
Triglycerides: 132 mg/dL (ref <150)

## 2023-11-22 NOTE — Progress Notes (Signed)
 LDL is mildly elevated.  Glucose is elevated, probably not a fasting sample.  CBC is normal except low lymphocyte count.  Immunosuppression.

## 2023-12-19 DIAGNOSIS — D3132 Benign neoplasm of left choroid: Secondary | ICD-10-CM | POA: Diagnosis not present

## 2023-12-19 DIAGNOSIS — H35371 Puckering of macula, right eye: Secondary | ICD-10-CM | POA: Diagnosis not present

## 2023-12-19 DIAGNOSIS — H353112 Nonexudative age-related macular degeneration, right eye, intermediate dry stage: Secondary | ICD-10-CM | POA: Diagnosis not present

## 2023-12-19 DIAGNOSIS — H353221 Exudative age-related macular degeneration, left eye, with active choroidal neovascularization: Secondary | ICD-10-CM | POA: Diagnosis not present

## 2023-12-27 ENCOUNTER — Other Ambulatory Visit: Payer: Self-pay | Admitting: Rheumatology

## 2023-12-27 DIAGNOSIS — Z79899 Other long term (current) drug therapy: Secondary | ICD-10-CM

## 2023-12-27 NOTE — Addendum Note (Signed)
 Addended by: Henriette Combs on: 12/27/2023 11:20 AM   Modules accepted: Orders

## 2023-12-27 NOTE — Telephone Encounter (Signed)
 Last Fill: 11/15/2023 (30 day supply)  Labs: 11/21/2023 LDL is mildly elevated.  Glucose is elevated, probably not a fasting sample.  CBC is normal except low lymphocyte count   Next Visit: 01/11/2024  Last Visit: 08/10/2023  DX: Rheumatoid arthritis of multiple sites with negative rheumatoid factor   Current Dose per office note 08/10/2023: Methotrexate 3 tablets by mouth every week   Okay to refill Methotrexate?

## 2023-12-28 NOTE — Progress Notes (Unsigned)
 Office Visit Note  Patient: Carlos Patton             Date of Birth: 17-Apr-1941           MRN: [id number]             PCP: Barnetta Liberty, MD Referring: Barnetta Liberty, MD Visit Date: 01/11/2024 Occupation: @GUAROCC @  Subjective:  Medication monitoring   History of Present Illness: Carlos Patton is a 83 y.o. male with history of seronegative rheumatoid arthritis.  Patient remains on  Methotrexate  3 tablets by mouth every week and folic acid  1 mg by mouth daily.  He is tolerating without any side effects and has not had any recent gaps in therapy.  Patient states that earlier this year he had an asp respiratory tract infection which lasted for 3 to 4 days but did not require antibiotics and did not require him to hold methotrexate .  He denies any other recent or recurrent infections.  He continues to experience some stiffness and discomfort in his hands due to underlying osteoarthritis but denies any joint swelling.  He has no difficulty with ADLs and has been remaining active.  He denies any nocturnal pain.  He denies any new medical conditions.  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   Activities of Daily Living:  Patient reports morning stiffness for 5 minutes.   Patient Denies nocturnal pain.  Difficulty dressing/grooming: Denies Difficulty climbing stairs: Denies Difficulty getting out of chair: Denies Difficulty using hands for taps, buttons, cutlery, and/or writing: Denies  Review of Systems  Constitutional:  Negative for fatigue.  HENT:  Negative for mouth sores and mouth dryness.   Eyes:  Negative for dryness.  Respiratory:  Negative for shortness of breath.   Cardiovascular:  Negative for chest pain and palpitations.  Gastrointestinal:  Positive for constipation. Negative for blood in stool and diarrhea.  Endocrine: Negative for increased urination.  Genitourinary:  Negative for  involuntary urination.  Musculoskeletal:  Positive for morning stiffness. Negative for joint pain, gait problem, joint pain, joint swelling, myalgias, muscle weakness, muscle tenderness and myalgias.  Skin:  Negative for color change, rash, hair loss and sensitivity to sunlight.  Allergic/Immunologic: Negative for susceptible to infections.  Neurological:  Negative for dizziness and headaches.  Hematological:  Negative for swollen glands.  Psychiatric/Behavioral:  Negative for depressed mood and sleep disturbance. The patient is not nervous/anxious.     PMFS History:  Patient Active Problem List   Diagnosis Date Noted   Sepsis (HCC) 09/12/2018   CAP (community acquired pneumonia) 09/12/2018   Hypertension    Rheumatoid arthritis of multiple sites with negative rheumatoid factor (HCC) 03/07/2018   Rheumatoid nodules (HCC) 03/07/2018   High risk medication use 03/07/2018   History of asthma 03/07/2018   History of hypertension 03/07/2018   History of hypercholesterolemia 03/07/2018   History of coronary artery disease 03/07/2018   Benign prostatic hyperplasia with urinary obstruction 06/24/2016   Elevated prostate specific antigen (PSA) 07/28/2011   Coronary atherosclerosis 04/27/2010   NONSPECIFIC ABNORMAL FINDING IN STOOL CONTENTS 04/27/2010   History of colonic polyps 04/27/2010    Past Medical History:  Diagnosis Date   Allergy    Arthritis    Asthma    oc flare in the past    CAP (community acquired pneumonia)    Cataract    Heart murmur    History of colon polyps    Hyperlipidemia  Hypertension    Macular degeneration 2025   per patient   Myocardial infarction (HCC)    2 MI--1980's, 1999   Sepsis (HCC)     Family History  Problem Relation Age of Onset   Colon cancer Sister    Arthritis Mother    Stroke Father    Heart attack Father    Heart Problems Brother    Heart Problems Brother    Gout Son    Colon polyps Neg Hx    Esophageal cancer Neg Hx    Rectal  cancer Neg Hx    Stomach cancer Neg Hx    Past Surgical History:  Procedure Laterality Date   CARDIAC CATHETERIZATION     CATARACT EXTRACTION Bilateral    COLONOSCOPY     CORONARY STENT PLACEMENT     INGUINAL HERNIA REPAIR Bilateral    POLYPECTOMY     Social History   Social History Narrative   Not on file   Immunization History  Administered Date(s) Administered   Moderna Sars-Covid-2 Vaccination 10/25/2019, 11/22/2019, 08/23/2020     Objective: Vital Signs: BP 132/80 (BP Location: Left Arm, Patient Position: Sitting, Cuff Size: Normal)   Pulse (!) 58   Ht 6\' 3"  (1.905 m)   Wt 207 lb 3.2 oz (94 kg)   BMI 25.90 kg/m    Physical Exam Vitals and nursing note reviewed.  Constitutional:      Appearance: He is well-developed.  HENT:     Head: Normocephalic and atraumatic.  Eyes:     Conjunctiva/sclera: Conjunctivae normal.     Pupils: Pupils are equal, round, and reactive to light.  Cardiovascular:     Rate and Rhythm: Normal rate and regular rhythm.     Heart sounds: Normal heart sounds.  Pulmonary:     Effort: Pulmonary effort is normal.     Breath sounds: Normal breath sounds.  Abdominal:     General: Bowel sounds are normal.     Palpations: Abdomen is soft.  Musculoskeletal:     Cervical back: Normal range of motion and neck supple.  Skin:    General: Skin is warm and dry.     Capillary Refill: Capillary refill takes less than 2 seconds.  Neurological:     Mental Status: He is alert and oriented to person, place, and time.  Psychiatric:        Behavior: Behavior normal.      Musculoskeletal Exam: C-spine has good range of motion.  Thoracic kyphosis was noted.  Lumbar scoliosis noted.  No midline spinal tenderness.  No SI joint tenderness.  Shoulder joints, elbow joints, wrist joints, MCPs, PIPs, DIPs have good range of motion with no synovitis.  PIP and DIP thickening consistent with osteoarthritis of both hands.  Nodulosis noted over the left index and  middle finger-unchanged.  Hip joints have good range of motion with no groin pain.  Knee joints have good range of motion with no warmth or effusion.  Ankle joints have good range of motion with no tenderness or joint swelling.  CDAI Exam: CDAI Score: -- Patient Global: --; Provider Global: -- Swollen: --; Tender: -- Joint Exam 01/11/2024   No joint exam has been documented for this visit   There is currently no information documented on the homunculus. Go to the Rheumatology activity and complete the homunculus joint exam.  Investigation: No additional findings.  Imaging: No results found.  Recent Labs: Lab Results  Component Value Date   WBC 5.8 11/21/2023   HGB  14.0 11/21/2023   PLT 166 11/21/2023   NA 139 11/21/2023   K 4.1 11/21/2023   CL 100 11/21/2023   CO2 31 11/21/2023   GLUCOSE 116 (H) 11/21/2023   BUN 19 11/21/2023   CREATININE 1.05 11/21/2023   BILITOT 0.7 11/21/2023   ALKPHOS 107 04/18/2019   AST 19 11/21/2023   ALT 11 11/21/2023   PROT 6.2 11/21/2023   ALBUMIN 4.2 04/18/2019   CALCIUM 9.7 11/21/2023   GFRAA 63 02/02/2021    Speciality Comments: No specialty comments available.  Procedures:  No procedures performed Allergies: Patient has no known allergies.     Assessment / Plan:     Visit Diagnoses: Rheumatoid arthritis of multiple sites with negative rheumatoid factor (HCC) - +CCP with nodulosis: He has no joint tenderness or synovitis on examination today.  He has not had any signs or symptoms of a rheumatoid arthritis flare.  He has been experiencing morning stiffness involving both hands and both feet lasting for about 5 minutes daily but has not had any nocturnal pain or difficulty with ADLs.  He has clinically been doing well taking methotrexate  3 tablets by mouth once weekly and folic acid  1 mg daily.  He is tolerating low-dose methotrexate  without any side effects and has not had any recent gaps in therapy.  He has not had any recurrent  infections.  He was advised to notify us  if he develops any signs or symptoms of a flare.  Discussed the importance of remaining active and exercising on a regular basis.  No medication changes will be made at this time.  He will follow up in 5 months or sooner if needed.    High risk medication use - Methotrexate  3 tablets by mouth every week, folic acid  1 mg  by mouth daily. CBC and CMP updated on 11/21/23. His next lab work will be due in June and every 3 months.  Standing orders for CBC and CMP remain in place. Lipid panel updated on 11/21/23.  No recurrent infections. Discussed the importance of holding methotrexate  if he develops signs or symptoms of an infection and to resume once the infection has completely cleared.    Rheumatoid nodules (HCC): Unchanged-left index and middle finger.  No new nodules.   Spondylosis of lumbar spine - X-rays obtained in November 2019 showed multilevel spondylosis with scoliosis and facet joint arthropathy.  Intermittent discomfort.  No symptoms of radiculopathy.  Other medical conditions are listed as follows:   History of hypercholesterolemia: Lipid panel updated on 11/21/2023: Total cholesterol 179, HDL 47, triglycerides 132, LDL 107.  Other idiopathic scoliosis, lumbar region: Intermittent discomfort.  No symptoms of radiculopathy.   History of hypertension: Blood pressure was 132/80 today in the office.  History of coronary artery disease  History of asthma  Orders: No orders of the defined types were placed in this encounter.  No orders of the defined types were placed in this encounter.     Follow-Up Instructions: Return in about 5 months (around 06/12/2024) for Rheumatoid arthritis.   Romayne Clubs, PA-C  Note - This record has been created using Dragon software.  Chart creation errors have been sought, but may not always  have been located. Such creation errors do not reflect on  the standard of medical care.

## 2024-01-11 ENCOUNTER — Encounter: Payer: Self-pay | Admitting: Physician Assistant

## 2024-01-11 ENCOUNTER — Ambulatory Visit: Payer: PPO | Attending: Physician Assistant | Admitting: Physician Assistant

## 2024-01-11 VITALS — BP 132/80 | HR 58 | Ht 75.0 in | Wt 207.2 lb

## 2024-01-11 DIAGNOSIS — M0630 Rheumatoid nodule, unspecified site: Secondary | ICD-10-CM | POA: Diagnosis not present

## 2024-01-11 DIAGNOSIS — Z8639 Personal history of other endocrine, nutritional and metabolic disease: Secondary | ICD-10-CM | POA: Diagnosis not present

## 2024-01-11 DIAGNOSIS — M4126 Other idiopathic scoliosis, lumbar region: Secondary | ICD-10-CM

## 2024-01-11 DIAGNOSIS — Z79899 Other long term (current) drug therapy: Secondary | ICD-10-CM

## 2024-01-11 DIAGNOSIS — Z8679 Personal history of other diseases of the circulatory system: Secondary | ICD-10-CM | POA: Diagnosis not present

## 2024-01-11 DIAGNOSIS — M0609 Rheumatoid arthritis without rheumatoid factor, multiple sites: Secondary | ICD-10-CM | POA: Diagnosis not present

## 2024-01-11 DIAGNOSIS — M47816 Spondylosis without myelopathy or radiculopathy, lumbar region: Secondary | ICD-10-CM | POA: Diagnosis not present

## 2024-01-11 DIAGNOSIS — Z8709 Personal history of other diseases of the respiratory system: Secondary | ICD-10-CM

## 2024-01-11 NOTE — Patient Instructions (Signed)

## 2024-01-16 DIAGNOSIS — D3132 Benign neoplasm of left choroid: Secondary | ICD-10-CM | POA: Diagnosis not present

## 2024-01-16 DIAGNOSIS — H353112 Nonexudative age-related macular degeneration, right eye, intermediate dry stage: Secondary | ICD-10-CM | POA: Diagnosis not present

## 2024-01-16 DIAGNOSIS — H35371 Puckering of macula, right eye: Secondary | ICD-10-CM | POA: Diagnosis not present

## 2024-01-16 DIAGNOSIS — H353221 Exudative age-related macular degeneration, left eye, with active choroidal neovascularization: Secondary | ICD-10-CM | POA: Diagnosis not present

## 2024-01-19 DIAGNOSIS — M069 Rheumatoid arthritis, unspecified: Secondary | ICD-10-CM | POA: Diagnosis not present

## 2024-01-19 DIAGNOSIS — I10 Essential (primary) hypertension: Secondary | ICD-10-CM | POA: Diagnosis not present

## 2024-01-19 DIAGNOSIS — R531 Weakness: Secondary | ICD-10-CM | POA: Diagnosis not present

## 2024-01-19 DIAGNOSIS — R5383 Other fatigue: Secondary | ICD-10-CM | POA: Diagnosis not present

## 2024-01-19 DIAGNOSIS — J40 Bronchitis, not specified as acute or chronic: Secondary | ICD-10-CM | POA: Diagnosis not present

## 2024-01-19 DIAGNOSIS — D84821 Immunodeficiency due to drugs: Secondary | ICD-10-CM | POA: Diagnosis not present

## 2024-01-19 DIAGNOSIS — R051 Acute cough: Secondary | ICD-10-CM | POA: Diagnosis not present

## 2024-01-19 DIAGNOSIS — Z1152 Encounter for screening for COVID-19: Secondary | ICD-10-CM | POA: Diagnosis not present

## 2024-02-08 DIAGNOSIS — I10 Essential (primary) hypertension: Secondary | ICD-10-CM | POA: Diagnosis not present

## 2024-02-08 DIAGNOSIS — J40 Bronchitis, not specified as acute or chronic: Secondary | ICD-10-CM | POA: Diagnosis not present

## 2024-02-08 DIAGNOSIS — K5900 Constipation, unspecified: Secondary | ICD-10-CM | POA: Diagnosis not present

## 2024-02-14 DIAGNOSIS — H353112 Nonexudative age-related macular degeneration, right eye, intermediate dry stage: Secondary | ICD-10-CM | POA: Diagnosis not present

## 2024-02-14 DIAGNOSIS — H353221 Exudative age-related macular degeneration, left eye, with active choroidal neovascularization: Secondary | ICD-10-CM | POA: Diagnosis not present

## 2024-02-14 DIAGNOSIS — H35371 Puckering of macula, right eye: Secondary | ICD-10-CM | POA: Diagnosis not present

## 2024-02-14 DIAGNOSIS — D3132 Benign neoplasm of left choroid: Secondary | ICD-10-CM | POA: Diagnosis not present

## 2024-03-20 ENCOUNTER — Other Ambulatory Visit: Payer: Self-pay | Admitting: *Deleted

## 2024-03-20 DIAGNOSIS — Z79899 Other long term (current) drug therapy: Secondary | ICD-10-CM

## 2024-03-20 LAB — COMPREHENSIVE METABOLIC PANEL WITH GFR
AG Ratio: 1.8 (calc) (ref 1.0–2.5)
ALT: 13 U/L (ref 9–46)
AST: 23 U/L (ref 10–35)
Albumin: 4 g/dL (ref 3.6–5.1)
Alkaline phosphatase (APISO): 109 U/L (ref 35–144)
BUN: 15 mg/dL (ref 7–25)
CO2: 31 mmol/L (ref 20–32)
Calcium: 9.3 mg/dL (ref 8.6–10.3)
Chloride: 102 mmol/L (ref 98–110)
Creat: 0.91 mg/dL (ref 0.70–1.22)
Globulin: 2.2 g/dL (ref 1.9–3.7)
Glucose, Bld: 79 mg/dL (ref 65–99)
Potassium: 3.5 mmol/L (ref 3.5–5.3)
Sodium: 138 mmol/L (ref 135–146)
Total Bilirubin: 0.6 mg/dL (ref 0.2–1.2)
Total Protein: 6.2 g/dL (ref 6.1–8.1)
eGFR: 84 mL/min/{1.73_m2} (ref >=60)

## 2024-03-20 LAB — CBC WITH DIFFERENTIAL/PLATELET
Absolute Lymphocytes: 563 {cells}/uL — ABNORMAL LOW (ref 850–3900)
Absolute Monocytes: 630 {cells}/uL (ref 200–950)
Basophils Absolute: 20 {cells}/uL (ref 0–200)
Basophils Relative: 0.3 %
Eosinophils Absolute: 268 {cells}/uL (ref 15–500)
Eosinophils Relative: 4 %
HCT: 40.9 % (ref 38.5–50.0)
Hemoglobin: 13.1 g/dL — ABNORMAL LOW (ref 13.2–17.1)
MCH: 31.6 pg (ref 27.0–33.0)
MCHC: 32 g/dL (ref 32.0–36.0)
MCV: 98.6 fL (ref 80.0–100.0)
MPV: 11.6 fL (ref 7.5–12.5)
Monocytes Relative: 9.4 %
Neutro Abs: 5219 {cells}/uL (ref 1500–7800)
Neutrophils Relative %: 77.9 %
Platelets: 141 10*3/uL (ref 140–400)
RBC: 4.15 10*6/uL — ABNORMAL LOW (ref 4.20–5.80)
RDW: 12.9 % (ref 11.0–15.0)
Total Lymphocyte: 8.4 %
WBC: 6.7 10*3/uL (ref 3.8–10.8)

## 2024-03-21 ENCOUNTER — Ambulatory Visit: Payer: Self-pay | Admitting: Rheumatology

## 2024-03-21 NOTE — Progress Notes (Signed)
 CMP normal, hemoglobin is low at 13.1.  Absolute lymphocyte count is low due to immunosuppression.  We will continue to monitor labs every 3 months.

## 2024-04-03 DIAGNOSIS — H353112 Nonexudative age-related macular degeneration, right eye, intermediate dry stage: Secondary | ICD-10-CM | POA: Diagnosis not present

## 2024-04-03 DIAGNOSIS — H35371 Puckering of macula, right eye: Secondary | ICD-10-CM | POA: Diagnosis not present

## 2024-04-03 DIAGNOSIS — H353221 Exudative age-related macular degeneration, left eye, with active choroidal neovascularization: Secondary | ICD-10-CM | POA: Diagnosis not present

## 2024-04-03 DIAGNOSIS — D3132 Benign neoplasm of left choroid: Secondary | ICD-10-CM | POA: Diagnosis not present

## 2024-04-08 ENCOUNTER — Other Ambulatory Visit: Payer: Self-pay | Admitting: Physician Assistant

## 2024-04-08 NOTE — Telephone Encounter (Signed)
 Last Fill: 12/27/2023  Labs: 03/20/2024 CMP normal, hemoglobin is low at 13.1. Absolute lymphocyte count is low due to immunosuppression.   Next Visit: 06/19/2024  Last Visit: 01/11/2024  DX: Rheumatoid arthritis of multiple sites with negative rheumatoid factor   Current Dose per office note 01/11/2024: Methotrexate  3 tablets by mouth every week   Okay to refill Methotrexate ?

## 2024-05-01 DIAGNOSIS — H353112 Nonexudative age-related macular degeneration, right eye, intermediate dry stage: Secondary | ICD-10-CM | POA: Diagnosis not present

## 2024-05-01 DIAGNOSIS — D3132 Benign neoplasm of left choroid: Secondary | ICD-10-CM | POA: Diagnosis not present

## 2024-05-01 DIAGNOSIS — H35371 Puckering of macula, right eye: Secondary | ICD-10-CM | POA: Diagnosis not present

## 2024-05-01 DIAGNOSIS — H353221 Exudative age-related macular degeneration, left eye, with active choroidal neovascularization: Secondary | ICD-10-CM | POA: Diagnosis not present

## 2024-05-29 DIAGNOSIS — T1511XA Foreign body in conjunctival sac, right eye, initial encounter: Secondary | ICD-10-CM | POA: Diagnosis not present

## 2024-05-29 DIAGNOSIS — H5711 Ocular pain, right eye: Secondary | ICD-10-CM | POA: Diagnosis not present

## 2024-06-05 NOTE — Progress Notes (Unsigned)
 Office Visit Note  Patient: Carlos Patton             Date of Birth: 1941/01/13           MRN: [id number]             PCP: Larnell Hamilton, MD Referring: Larnell Hamilton, MD Visit Date: 06/19/2024 Occupation: Data Unavailable  Subjective:  Medication monitoring  History of Present Illness: Carlos Patton is a 83 y.o. male with history of seronegative rheumatoid arthritis.  Patient remains on  Methotrexate  3 tablets by mouth every week and folic acid  1 mg by mouth daily.  He is tolerating methotrexate  without any side effects and has not had any gaps in therapy.  He denies any signs or symptoms of a rheumatoid arthritis flare.  His morning stiffness has been lasting for a few minutes daily.  He denies any nocturnal pain or difficulty performing ADLs.  He remains active on a daily basis without difficulty.  He experiences some stiffness when rising from a seated position but is otherwise doing well.  He denies any recent or recurrent infections.     Activities of Daily Living:  Patient reports morning stiffness for a few minutes.   Patient Denies nocturnal pain.  Difficulty dressing/grooming: Denies Difficulty climbing stairs: Denies Difficulty getting out of chair: Denies Difficulty using hands for taps, buttons, cutlery, and/or writing: Denies  Review of Systems  Constitutional:  Negative for fatigue.  HENT:  Negative for mouth sores and mouth dryness.   Eyes:  Positive for dryness.  Respiratory:  Negative for shortness of breath.   Cardiovascular:  Negative for chest pain and palpitations.  Gastrointestinal:  Negative for blood in stool, constipation and diarrhea.  Endocrine: Positive for increased urination.  Genitourinary:  Negative for involuntary urination.  Musculoskeletal:  Positive for morning stiffness. Negative for joint pain, gait problem, joint pain, joint swelling, myalgias, muscle weakness, muscle tenderness and myalgias.  Skin:  Negative for color change,  rash, hair loss and sensitivity to sunlight.  Allergic/Immunologic: Negative for susceptible to infections.  Neurological:  Negative for dizziness and headaches.  Hematological:  Negative for swollen glands.  Psychiatric/Behavioral:  Negative for depressed mood and sleep disturbance. The patient is not nervous/anxious.     PMFS History:  Patient Active Problem List   Diagnosis Date Noted   Sepsis (HCC) 09/12/2018   CAP (community acquired pneumonia) 09/12/2018   Hypertension    Rheumatoid arthritis of multiple sites with negative rheumatoid factor (HCC) 03/07/2018   Rheumatoid nodules (HCC) 03/07/2018   High risk medication use 03/07/2018   History of asthma 03/07/2018   History of hypertension 03/07/2018   History of hypercholesterolemia 03/07/2018   History of coronary artery disease 03/07/2018   Benign prostatic hyperplasia with urinary obstruction 06/24/2016   Elevated prostate specific antigen (PSA) 07/28/2011   Coronary atherosclerosis 04/27/2010   NONSPECIFIC ABNORMAL FINDING IN STOOL CONTENTS 04/27/2010   History of colonic polyps 04/27/2010    Past Medical History:  Diagnosis Date   Allergy    Arthritis    Asthma    oc flare in the past    CAP (community acquired pneumonia)    Cataract    Heart murmur    History of colon polyps    Hyperlipidemia    Hypertension    Macular degeneration 2025   per patient   Myocardial infarction (HCC)    2 MI--1980's, 1999   Sepsis (HCC)     Family History  Problem Relation Age  of Onset   Colon cancer Sister    Arthritis Mother    Stroke Father    Heart attack Father    Heart Problems Brother    Heart Problems Brother    Gout Son    Colon polyps Neg Hx    Esophageal cancer Neg Hx    Rectal cancer Neg Hx    Stomach cancer Neg Hx    Past Surgical History:  Procedure Laterality Date   CARDIAC CATHETERIZATION     CATARACT EXTRACTION Bilateral    COLONOSCOPY     CORONARY STENT PLACEMENT     INGUINAL HERNIA REPAIR  Bilateral    POLYPECTOMY     Social History   Tobacco Use   Smoking status: Former    Current packs/day: 0.00    Average packs/day: 1 pack/day for 40.0 years (40.0 ttl pk-yrs)    Types: Cigarettes    Start date: 01/08/1950    Quit date: 01/08/1990    Years since quitting: 34.4    Passive exposure: Never   Smokeless tobacco: Former    Types: Engineer, drilling   Vaping status: Never Used  Substance Use Topics   Alcohol  use: No    Alcohol /week: 0.0 standard drinks of alcohol    Drug use: Never   Social History   Social History Narrative   Not on file     Immunization History  Administered Date(s) Administered   Moderna Sars-Covid-2 Vaccination 10/25/2019, 11/22/2019, 08/23/2020     Objective: Vital Signs: BP 118/66   Pulse 76   Temp 97.9 F (36.6 C)   Resp 14   Ht 6' 3 (1.905 m)   Wt 204 lb 12.8 oz (92.9 kg)   BMI 25.60 kg/m    Physical Exam Vitals and nursing note reviewed.  Constitutional:      Appearance: He is well-developed.  HENT:     Head: Normocephalic and atraumatic.  Eyes:     Conjunctiva/sclera: Conjunctivae normal.     Pupils: Pupils are equal, round, and reactive to light.  Cardiovascular:     Rate and Rhythm: Normal rate and regular rhythm.     Heart sounds: Normal heart sounds.  Pulmonary:     Effort: Pulmonary effort is normal.     Breath sounds: Normal breath sounds.  Abdominal:     General: Bowel sounds are normal.     Palpations: Abdomen is soft.  Musculoskeletal:     Cervical back: Normal range of motion and neck supple.  Skin:    General: Skin is warm and dry.     Capillary Refill: Capillary refill takes less than 2 seconds.  Neurological:     Mental Status: He is alert and oriented to person, place, and time.  Psychiatric:        Behavior: Behavior normal.      Musculoskeletal Exam: C-spine has slightly limited range of motion.  Thoracic kyphosis.  Lumbar scoliosis noted.  Shoulder joints have good range of motion.  Elbow  joints, wrist joints, MCPs, PIPs, DIPs have good range of motion with no synovitis.  PIP DIP thickening consistent with osteoarthritis of both hands.  CMC joint prominence bilaterally.  Nodulosis overlying the left index and middle finger.  Hip joints have good range of motion with no groin pain.  Knee joints have good range of motion no warmth or effusion.  Ankle joints have good range of motion.  No evidence of joint swelling.  CDAI Exam: CDAI Score: -- Patient Global: --; Provider Global: --  Swollen: --; Tender: -- Joint Exam 06/19/2024   No joint exam has been documented for this visit   There is currently no information documented on the homunculus. Go to the Rheumatology activity and complete the homunculus joint exam.  Investigation: No additional findings.  Imaging: No results found.  Recent Labs: Lab Results  Component Value Date   WBC 6.7 03/20/2024   HGB 13.1 (L) 03/20/2024   PLT 141 03/20/2024   NA 138 03/20/2024   K 3.5 03/20/2024   CL 102 03/20/2024   CO2 31 03/20/2024   GLUCOSE 79 03/20/2024   BUN 15 03/20/2024   CREATININE 0.91 03/20/2024   BILITOT 0.6 03/20/2024   ALKPHOS 107 04/18/2019   AST 23 03/20/2024   ALT 13 03/20/2024   PROT 6.2 03/20/2024   ALBUMIN 4.2 04/18/2019   CALCIUM 9.3 03/20/2024   GFRAA 63 02/02/2021    Speciality Comments: No specialty comments available.  Procedures:  No procedures performed Allergies: Patient has no known allergies.   Assessment / Plan:     Visit Diagnoses: Rheumatoid arthritis of multiple sites with negative rheumatoid factor (HCC) - +CCP with nodulosis: He has no synovitis on examination today.  He has not had any signs or symptoms of a rheumatoid arthritis flare.  He has clinically been doing well on methotrexate  3 tablets by mouth once weekly and folic acid  1 mg daily.  He is tolerating combination therapy without any side effects and has not had any gaps in therapy.  No recent or recurrent infections.  His  morning stiffness has only been lasting for few minutes daily.  He has not had any nocturnal pain or difficulty performing ADLs.  He experiences some stiffness in his knees when rising from a seated position but has otherwise been doing well on the current treatment regimen.  No medication changes will be made at this time.  He will follow-up in the office in 5 months or sooner if needed.  High risk medication use - Methotrexate  3 tablets by mouth every week, folic acid  1 mg  by mouth daily.  CBC and CMP updated on 03/20/24. Orders for CBC and CMP released today.  His next lab work will be due in January and every 3 months to monitor for drug toxicity. Lipid panel updated on 11/21/23.   No recent or recurrent infections.  Discussed the importance of holding methotrexate  if he develops signs or symptoms of an infection and to resume once the infection has completely cleared.   - Plan: CBC with Differential/Platelet, Comprehensive metabolic panel with GFR, CMP14+EGFR, CBC with Differential/Platelet  Rheumatoid nodules (HCC) - Unchanged-left index and middle finger.  No new nodules.  Spondylosis of lumbar spine - X-rays obtained in November 2019 showed multilevel spondylosis with scoliosis and facet joint arthropathy.  Intermittent discomfort.  History of hypercholesterolemia: Lipid panel obtained on 11/21/2023: Total cholesterol 179, HDL 47, triglycerides 132, LDL 107.  Other idiopathic scoliosis, lumbar region  Other medical conditions are listed as follows:  History of hypertension - BP was 118/66 today in the office.  History of coronary artery disease  History of asthma  Orders: Orders Placed This Encounter  Procedures   CBC with Differential/Platelet   Comprehensive metabolic panel with GFR   CMP14+EGFR   CBC with Differential/Platelet   No orders of the defined types were placed in this encounter.   Follow-Up Instructions: Return in about 5 months (around 11/17/2024) for Rheumatoid  arthritis.   Waddell CHRISTELLA Craze, PA-C  Note - This  record has been created using [employer].  Chart creation errors have been sought, but may not always  have been located. Such creation errors do not reflect on  the standard of medical care.

## 2024-06-19 ENCOUNTER — Encounter: Payer: Self-pay | Admitting: Physician Assistant

## 2024-06-19 ENCOUNTER — Ambulatory Visit: Attending: Physician Assistant | Admitting: Physician Assistant

## 2024-06-19 VITALS — BP 118/66 | HR 76 | Temp 97.9°F | Resp 14 | Ht 75.0 in | Wt 204.8 lb

## 2024-06-19 DIAGNOSIS — M0630 Rheumatoid nodule, unspecified site: Secondary | ICD-10-CM

## 2024-06-19 DIAGNOSIS — Z8709 Personal history of other diseases of the respiratory system: Secondary | ICD-10-CM

## 2024-06-19 DIAGNOSIS — Z79899 Other long term (current) drug therapy: Secondary | ICD-10-CM | POA: Diagnosis not present

## 2024-06-19 DIAGNOSIS — M0609 Rheumatoid arthritis without rheumatoid factor, multiple sites: Secondary | ICD-10-CM

## 2024-06-19 DIAGNOSIS — M4126 Other idiopathic scoliosis, lumbar region: Secondary | ICD-10-CM | POA: Diagnosis not present

## 2024-06-19 DIAGNOSIS — Z8639 Personal history of other endocrine, nutritional and metabolic disease: Secondary | ICD-10-CM

## 2024-06-19 DIAGNOSIS — Z8679 Personal history of other diseases of the circulatory system: Secondary | ICD-10-CM | POA: Diagnosis not present

## 2024-06-19 DIAGNOSIS — M47816 Spondylosis without myelopathy or radiculopathy, lumbar region: Secondary | ICD-10-CM

## 2024-06-19 LAB — CBC WITH DIFFERENTIAL/PLATELET
Absolute Lymphocytes: 806 {cells}/uL — ABNORMAL LOW (ref 850–3900)
Absolute Monocytes: 495 {cells}/uL (ref 200–950)
Basophils Absolute: 41 {cells}/uL (ref 0–200)
Basophils Relative: 0.8 %
Eosinophils Absolute: 199 {cells}/uL (ref 15–500)
Eosinophils Relative: 3.9 %
HCT: 41.3 % (ref 38.5–50.0)
Hemoglobin: 13.8 g/dL (ref 13.2–17.1)
MCH: 32.9 pg (ref 27.0–33.0)
MCHC: 33.4 g/dL (ref 32.0–36.0)
MCV: 98.3 fL (ref 80.0–100.0)
MPV: 11.8 fL (ref 7.5–12.5)
Monocytes Relative: 9.7 %
Neutro Abs: 3560 {cells}/uL (ref 1500–7800)
Neutrophils Relative %: 69.8 %
Platelets: 157 Thousand/uL (ref 140–400)
RBC: 4.2 Million/uL (ref 4.20–5.80)
RDW: 13.1 % (ref 11.0–15.0)
Total Lymphocyte: 15.8 %
WBC: 5.1 Thousand/uL (ref 3.8–10.8)

## 2024-06-19 LAB — COMPREHENSIVE METABOLIC PANEL WITH GFR
AG Ratio: 2 (calc) (ref 1.0–2.5)
ALT: 13 U/L (ref 9–46)
AST: 24 U/L (ref 10–35)
Albumin: 4.3 g/dL (ref 3.6–5.1)
Alkaline phosphatase (APISO): 88 U/L (ref 35–144)
BUN: 14 mg/dL (ref 7–25)
CO2: 30 mmol/L (ref 20–32)
Calcium: 9.5 mg/dL (ref 8.6–10.3)
Chloride: 103 mmol/L (ref 98–110)
Creat: 0.96 mg/dL (ref 0.70–1.22)
Globulin: 2.1 g/dL (ref 1.9–3.7)
Glucose, Bld: 73 mg/dL (ref 65–139)
Potassium: 3.8 mmol/L (ref 3.5–5.3)
Sodium: 141 mmol/L (ref 135–146)
Total Bilirubin: 0.9 mg/dL (ref 0.2–1.2)
Total Protein: 6.4 g/dL (ref 6.1–8.1)
eGFR: 78 mL/min/1.73m2 (ref >=60)

## 2024-06-19 NOTE — Patient Instructions (Signed)
 Standing Labs We placed an order today for your standing lab work.   Please have your standing labs drawn in January and every 3 months   Please have your labs drawn 2 weeks prior to your appointment so that the provider can discuss your lab results at your appointment, if possible.  Please note that you may see your imaging and lab results in MyChart before we have reviewed them. We will contact you once all results are reviewed. Please allow our office up to 72 hours to thoroughly review all of the results before contacting the office for clarification of your results.  WALK-IN LAB HOURS  Monday through Thursday from 8:00 am -12:30 pm and 1:00 pm-4:30 pm and Friday from 8:00 am-12:00 pm.  Patients with office visits requiring labs will be seen before walk-in labs.  You may encounter longer than normal wait times. Please allow additional time. Wait times may be shorter on  Monday and Thursday afternoons.  We do not book appointments for walk-in labs. We appreciate your patience and understanding with our staff.   Labs are drawn by [facility]. Please bring your co-pay at the time of your lab draw.  You may receive a bill from [facility] for your lab work.  Please note if you are on Hydroxychloroquine  and and an order has been placed for a Hydroxychloroquine  level,  you will need to have it drawn 4 hours or more after your last dose.  If you wish to have your labs drawn at another location, please call the office 24 hours in advance so we can fax the orders.  The office is located at [address], [address], [place], [place] [address]   If you have any questions regarding directions or hours of operation,  please call [phone].   As a reminder, please drink plenty of water prior to coming for your lab work. Thanks!

## 2024-06-20 ENCOUNTER — Ambulatory Visit: Payer: Self-pay | Admitting: Physician Assistant

## 2024-06-20 NOTE — Progress Notes (Signed)
 CMP WNL Absolute lymphocytes are slightly low but improving. Rest of CBC WNL.

## 2024-06-26 DIAGNOSIS — H35371 Puckering of macula, right eye: Secondary | ICD-10-CM | POA: Diagnosis not present

## 2024-06-26 DIAGNOSIS — H353221 Exudative age-related macular degeneration, left eye, with active choroidal neovascularization: Secondary | ICD-10-CM | POA: Diagnosis not present

## 2024-06-26 DIAGNOSIS — H353112 Nonexudative age-related macular degeneration, right eye, intermediate dry stage: Secondary | ICD-10-CM | POA: Diagnosis not present

## 2024-06-26 DIAGNOSIS — D3132 Benign neoplasm of left choroid: Secondary | ICD-10-CM | POA: Diagnosis not present

## 2024-07-01 DIAGNOSIS — Z23 Encounter for immunization: Secondary | ICD-10-CM | POA: Diagnosis not present

## 2024-07-15 ENCOUNTER — Other Ambulatory Visit: Payer: Self-pay | Admitting: Physician Assistant

## 2024-07-15 NOTE — Telephone Encounter (Signed)
 Last Fill: 04/08/2024  Labs: 06/19/2024 CMP WNL Absolute lymphocytes are slightly low but improving. Rest of CBC WNL.  Next Visit: 11/19/2024  Last Visit: 06/19/2024  DX: Rheumatoid arthritis of multiple sites with negative rheumatoid factor   Current Dose per office note 06/19/2024: Methotrexate  3 tablets by mouth every week   Okay to refill Methotrexate ?

## 2024-08-06 DIAGNOSIS — H353221 Exudative age-related macular degeneration, left eye, with active choroidal neovascularization: Secondary | ICD-10-CM | POA: Diagnosis not present

## 2024-08-06 DIAGNOSIS — H524 Presbyopia: Secondary | ICD-10-CM | POA: Diagnosis not present

## 2024-08-26 DIAGNOSIS — H353221 Exudative age-related macular degeneration, left eye, with active choroidal neovascularization: Secondary | ICD-10-CM | POA: Diagnosis not present

## 2024-08-26 DIAGNOSIS — H35371 Puckering of macula, right eye: Secondary | ICD-10-CM | POA: Diagnosis not present

## 2024-08-26 DIAGNOSIS — D3132 Benign neoplasm of left choroid: Secondary | ICD-10-CM | POA: Diagnosis not present

## 2024-08-26 DIAGNOSIS — H353112 Nonexudative age-related macular degeneration, right eye, intermediate dry stage: Secondary | ICD-10-CM | POA: Diagnosis not present

## 2024-11-19 ENCOUNTER — Ambulatory Visit: Admitting: Physician Assistant
